# Patient Record
Sex: Male | Born: 1960 | Hispanic: No | Marital: Single | Smoking: Current every day smoker
Health system: Southern US, Community
[De-identification: ages and names within clinical notes are randomized; demographics above are authoritative.]

## PROBLEM LIST (undated history)

## (undated) DIAGNOSIS — R000 Tachycardia, unspecified: Secondary | ICD-10-CM

## (undated) DIAGNOSIS — I10 Essential (primary) hypertension: Secondary | ICD-10-CM

## (undated) DIAGNOSIS — T7840XA Allergy, unspecified, initial encounter: Secondary | ICD-10-CM

## (undated) HISTORY — DX: Allergy, unspecified, initial encounter: T78.40XA

## (undated) HISTORY — PX: APPENDECTOMY: SHX54

## (undated) HISTORY — DX: Tachycardia, unspecified: R00.0

## (undated) HISTORY — DX: Essential (primary) hypertension: I10

---

## 2021-05-13 ENCOUNTER — Ambulatory Visit: Payer: Self-pay

## 2021-05-13 DIAGNOSIS — I10 Essential (primary) hypertension: Secondary | ICD-10-CM

## 2021-05-13 NOTE — Telephone Encounter (Signed)
Second attempt to reach pt, left VM to call back. 

## 2021-05-13 NOTE — Telephone Encounter (Signed)
3 attempts to reach pt.. left VM to CB each attempt. Routing to provider for resolution per protocol.  

## 2021-05-13 NOTE — Telephone Encounter (Signed)
[place]. He is taking lisinopril and will run out in 3 days. Pt does not have an appointment until February. Would like clinical advice

## 2021-05-15 NOTE — Telephone Encounter (Signed)
Per Dr.Vigg would like to see the patient before his scheduled in office appointment on 05/29/21. Patient states she would like to see patient virtually tomorrow so she can confirm with patient of what dosing he is currently taking of blood pressure medication, before sending the prescription over to patient local pharmacy.

## 2021-05-15 NOTE — Telephone Encounter (Signed)
Patient calling back regarding his lisinopril 10 mg-hydrochlorothiazide 12.5 mg, he's out of medication and wanted to get a thirty day supply until his 12/14 appt. He called previous PCP and they wouldn't refill his medicine without an appt.   Patient stated that Destiny from the practice confirmed a refill would granted prior to his new patient appt.   ----- Message from Valora Piccolo sent at 05/13/2021  1:51 PM EST -----  Pt is calling back to speak to NT   ----- Message from Fanny Bien sent at 05/13/2021  9:57 AM EST -----  Pt states that [place] is [place]. He is taking lisinopril and will run out in 3 days. Pt does not have an appointment until February. Would like clinical advice   Called patient to review options for getting medications prescribed until 05/29/21. Patient requesting lisinopril 10 mg - hydrochlorothiazide 12.5 mg until his office visit . Patient reports he has some benazepril that he stopped taking due to causing chest pain he can break in 1/2 . Instructed patient not to take medication that causes chest pain. Instructed patient to go to UC or ED for prescription medication . Previous PCP requesting he comes back for OV and patient reports he can not do that. Please advise if a virtual visit can be set up with PCP today or tomorrow. Patient is out of medication. Care advise given. Patient verbalized understanding of care advise to go to UC or  ED for medication but feels he will go to [facility] or ED if symptoms occur. Please advise . Patient reports "Destiny" requested to call office regarding medications.

## 2021-05-16 ENCOUNTER — Telehealth (INDEPENDENT_AMBULATORY_CARE_PROVIDER_SITE_OTHER): Payer: Self-pay | Admitting: Internal Medicine

## 2021-05-16 DIAGNOSIS — Z13220 Encounter for screening for lipoid disorders: Secondary | ICD-10-CM | POA: Insufficient documentation

## 2021-05-16 DIAGNOSIS — I10 Essential (primary) hypertension: Secondary | ICD-10-CM

## 2021-05-16 MED ORDER — LISINOPRIL-HYDROCHLOROTHIAZIDE 10-12.5 MG PO TABS: 1.0000 | ORAL_TABLET | Freq: Every day | ORAL | 2 refills | Status: DC

## 2021-05-16 NOTE — Progress Notes (Signed)
There were no vitals taken for this visit.   Subjective:    Patient ID: Luke Griffin, male    DOB: 1960-07-15, 60 y.o.   MRN: [id number]  No chief complaint on file.   HPI: Luke Griffin is a 60 y.o. male   This visit was completed via telephone due to the restrictions of the COVID-19 pandemic. All issues as above were discussed and addressed but no physical exam was performed. If it was felt that the patient should be evaluated in the office, they were directed there. The patient verbally consented to this visit. Patient was unable to complete an audio/visual visit due to Technical difficulties. Due to the catastrophic nature of the COVID-19 pandemic, this visit was done through audio contact only. Location of the patient: home Location of the provider: work Those involved with this call:  Provider: Loura Pardon, MD CMA: Tristan Schroeder, CMA Front Desk/Registration: [employer]  Time spent on call: 15` minutes on the phone discussing health concerns. 10 minutes total spent in review of patient's record and preparation of their chart.  He is here to establish care, he has been on meds x 10 yrs.  Was in [place] and then in [place]. Moved to [place] x 6 months ago not seen anyone yet.    Hypertension This is a chronic problem. The current episode started more than 1 year ago. The problem is unchanged. The problem is controlled. Pertinent negatives include no anxiety, blurred vision, chest pain, headaches, malaise/fatigue, neck pain, orthopnea, palpitations, peripheral edema, PND, shortness of breath or sweats.   No chief complaint on file.   Relevant past medical, surgical, family and social history reviewed and updated as indicated. Interim medical history since our last visit reviewed. Allergies and medications reviewed and updated.  Review of Systems  Constitutional:  Negative for malaise/fatigue.  Eyes:  Negative for blurred vision.  Respiratory:  Negative for shortness  of breath.   Cardiovascular:  Negative for chest pain, palpitations, orthopnea and PND.  Musculoskeletal:  Negative for neck pain.  Neurological:  Negative for headaches.   Per HPI unless specifically indicated above     Objective:    There were no vitals taken for this visit.  Wt Readings from Last 3 Encounters:  05/17/21 192 lb 12.8 oz (87.5 kg)    Physical Exam  Unable to peform sec to virtual visit.   No results found for this or any previous visit.      Current Outpatient Medications:    lisinopril-hydrochlorothiazide (ZESTORETIC) 10-12.5 MG tablet, Take 1 tablet by mouth daily., Disp: 30 tablet, Rfl: 2   atorvastatin (LIPITOR) 20 MG tablet, Take 1 tablet (20 mg total) by mouth daily., Disp: 30 tablet, Rfl: 3   buPROPion (WELLBUTRIN XL) 150 MG 24 hr tablet, Take 1 tablet (150 mg total) by mouth daily., Disp: 30 tablet, Rfl: 1   fluticasone (FLONASE) 50 MCG/ACT nasal spray, Place 2 sprays into both nostrils daily., Disp: 9.9 mL, Rfl: 3   tadalafil (CIALIS) 5 MG tablet, Take 1 tablet (5 mg total) by mouth daily., Disp: 10 tablet, Rfl: 1    Assessment & Plan:   HTN : took his meds yesterday - is on lisinopril /hctz 10 mg / 12.5  132/ 85 mm hg.  Continue current meds.  Medication compliance emphasised. pt advised to keep Bp logs. Pt verbalised understanding of the same. Pt to have a low salt diet . Exercise to reach a goal of at least 150 mins a week.  lifestyle modifications explained and pt understands importance of the above.  Problem List Items Addressed This Visit       Cardiovascular and Mediastinum   Primary hypertension - Primary   Relevant Medications   lisinopril-hydrochlorothiazide (ZESTORETIC) 10-12.5 MG tablet   Other Relevant Orders   CBC with Differential/Platelet (Completed)   Comprehensive metabolic panel (Completed)   Urinalysis, Routine w reflex microscopic (Completed)   TSH (Completed)     Other   Screening for lipid disorders   Relevant Orders    Lipid panel (Completed)     Orders Placed This Encounter  Procedures   CBC with Differential/Platelet   Comprehensive metabolic panel   Urinalysis, Routine w reflex microscopic   TSH   Lipid panel      Meds ordered this encounter  Medications   lisinopril-hydrochlorothiazide (ZESTORETIC) 10-12.5 MG tablet    Sig: Take 1 tablet by mouth daily.    Dispense:  30 tablet    Refill:  2      Follow up plan: No follow-ups on file.

## 2021-05-17 ENCOUNTER — Other Ambulatory Visit: Payer: Self-pay

## 2021-05-17 ENCOUNTER — Ambulatory Visit (INDEPENDENT_AMBULATORY_CARE_PROVIDER_SITE_OTHER): Payer: Self-pay | Admitting: Internal Medicine

## 2021-05-17 ENCOUNTER — Encounter: Payer: Self-pay | Admitting: Internal Medicine

## 2021-05-17 VITALS — BP 156/100 | HR 91 | Temp 98.2°F | Ht 68.11 in | Wt 192.8 lb

## 2021-05-17 DIAGNOSIS — I10 Essential (primary) hypertension: Secondary | ICD-10-CM

## 2021-05-17 DIAGNOSIS — Z13220 Encounter for screening for lipoid disorders: Secondary | ICD-10-CM

## 2021-05-17 DIAGNOSIS — N529 Male erectile dysfunction, unspecified: Secondary | ICD-10-CM

## 2021-05-17 MED ORDER — BUPROPION HCL ER (XL) 150 MG PO TB24: 150.0000 mg | ORAL_TABLET | Freq: Every day | ORAL | 1 refills | Status: DC

## 2021-05-17 MED ORDER — FLUTICASONE PROPIONATE 50 MCG/ACT NA SUSP: 2.0000 | Freq: Every day | NASAL | 3 refills | Status: DC

## 2021-05-17 MED ORDER — TADALAFIL 5 MG PO TABS: 5.0000 mg | ORAL_TABLET | Freq: Every day | ORAL | 1 refills | Status: DC

## 2021-05-17 NOTE — Patient Instructions (Signed)

## 2021-05-17 NOTE — Progress Notes (Signed)
BP (!) 156/100   Pulse 91   Temp 98.2 F (36.8 C) (Oral)   Ht 5' 8.11" (1.73 m)   Wt 192 lb 12.8 oz (87.5 kg)   SpO2 98%   BMI 29.22 kg/m    Subjective:    Patient ID: Luke Griffin, male    DOB: 1961-02-28, 60 y.o.   MRN: [id number]  Chief Complaint  Patient presents with  . New Patient (Initial Visit)    Needs med refills Would like to talk to you about ED  . Hypertension    HPI: Luke Griffin is a 60 y.o. male  Pt is here to establish care. 135/145 --  85 - 90 on lisinopril / hczt 10/ 12.5 mg  Seen Dr. Ilean China @ [facility].   Hypertension This is a chronic problem. The current episode started in the past 7 days. The problem has been gradually worsening since onset. The problem is uncontrolled. Pertinent negatives include no anxiety, blurred vision, headaches, malaise/fatigue, neck pain, orthopnea, palpitations, peripheral edema, PND or sweats.   Chief Complaint  Patient presents with  . New Patient (Initial Visit)    Needs med refills Would like to talk to you about ED  . Hypertension    Relevant past medical, surgical, family and social history reviewed and updated as indicated. Interim medical history since our last visit reviewed. Allergies and medications reviewed and updated.  Review of Systems  Constitutional:  Negative for activity change, appetite change, chills, fatigue, fever and malaise/fatigue.  HENT:  Negative for congestion, ear discharge, ear pain and facial swelling.   Eyes:  Negative for blurred vision, pain, discharge and itching.  Respiratory:  Negative for cough, chest tightness and wheezing.   Cardiovascular:  Negative for palpitations, orthopnea, leg swelling and PND.  Gastrointestinal:  Negative for abdominal distention, abdominal pain, blood in stool, constipation, diarrhea, nausea and vomiting.  Endocrine: Negative for cold intolerance, heat intolerance, polydipsia, polyphagia and polyuria.  Genitourinary:  Negative for  difficulty urinating, dysuria, flank pain, frequency, hematuria and urgency.  Musculoskeletal:  Negative for arthralgias, gait problem, joint swelling and neck pain.  Skin:  Negative for color change, rash and wound.  Neurological:  Negative for dizziness, tremors, speech difficulty, weakness, light-headedness, numbness and headaches.  Hematological:  Does not bruise/bleed easily.  Psychiatric/Behavioral:  Negative for agitation, confusion, decreased concentration, sleep disturbance and suicidal ideas.    Per HPI unless specifically indicated above     Objective:    BP (!) 156/100   Pulse 91   Temp 98.2 F (36.8 C) (Oral)   Ht 5' 8.11" (1.73 m)   Wt 192 lb 12.8 oz (87.5 kg)   SpO2 98%   BMI 29.22 kg/m   Wt Readings from Last 3 Encounters:  05/17/21 192 lb 12.8 oz (87.5 kg)    Physical Exam Vitals and nursing note reviewed.  Constitutional:      General: He is not in acute distress.    Appearance: Normal appearance. He is not ill-appearing or diaphoretic.  HENT:     Head: Normocephalic and atraumatic.     Right Ear: Tympanic membrane and external ear normal. There is no impacted cerumen.     Left Ear: External ear normal.     Nose: No congestion or rhinorrhea.     Mouth/Throat:     Pharynx: No oropharyngeal exudate or posterior oropharyngeal erythema.  Eyes:     Conjunctiva/sclera: Conjunctivae normal.     Pupils: Pupils are equal, round, and reactive to light.  Cardiovascular:     Rate and Rhythm: Normal rate and regular rhythm.     Heart sounds: No murmur heard.   No friction rub. No gallop.  Pulmonary:     Effort: No respiratory distress.     Breath sounds: No stridor. No wheezing or rhonchi.  Chest:     Chest wall: No tenderness.  Abdominal:     General: Abdomen is flat. Bowel sounds are normal. There is no distension.     Palpations: Abdomen is soft. There is no mass.     Tenderness: There is no abdominal tenderness. There is no guarding.  Musculoskeletal:         General: No swelling or deformity.     Cervical back: Normal range of motion and neck supple. No rigidity or tenderness.     Right lower leg: No edema.     Left lower leg: No edema.  Skin:    General: Skin is warm and dry.     Coloration: Skin is not jaundiced.     Findings: No erythema.  Neurological:     Mental Status: He is alert and oriented to person, place, and time. Mental status is at baseline.  Psychiatric:        Mood and Affect: Mood normal.        Thought Content: Thought content normal.        Judgment: Judgment normal.    No results found for this or any previous visit.      Current Outpatient Medications:  .  lisinopril-hydrochlorothiazide (ZESTORETIC) 10-12.5 MG tablet, Take 1 tablet by mouth daily., Disp: 30 tablet, Rfl: 2 .  fluticasone (FLONASE) 50 MCG/ACT nasal spray, Place 2 sprays into both nostrils daily., Disp: 9.9 mL, Rfl: 3    Assessment & Plan:  HTN:  Hasnt picked up meds yet  Continue current meds.  Medication compliance emphasised. pt advised to keep Bp logs. Pt verbalised understanding of the same. Pt to have a low salt diet . Exercise to reach a goal of at least 150 mins a week.  lifestyle modifications explained and pt understands importance of the above.   2.smoking cessation: will need to start pt on welbutrin xl 150 mg q 24 hrly fu with me x 4 weeks.    quit cocaine 10 yrs ago Smoking cessation advised. failed nicotine patches in the past. continues to smoke. more than > 5 - 10 mins of time was spent with pt regarding smoking cessation and complications.        Problem List Items Addressed This Visit       Cardiovascular and Mediastinum   Primary hypertension     Other   Screening for lipid disorders     No orders of the defined types were placed in this encounter.    Meds ordered this encounter  Medications  . fluticasone (FLONASE) 50 MCG/ACT nasal spray    Sig: Place 2 sprays into both nostrils daily.    Dispense:  9.9  mL    Refill:  3     Follow up plan: No follow-ups on file.  Health Maintenance : Cscope :Cscope - 2012  PSA wnl last checked x 4 yr ago.  Pneumonia vaccine : prenvanar / pneumovax @ Health dept.

## 2021-05-18 LAB — LIPID PANEL
Chol/HDL Ratio: 5.4 ratio — ABNORMAL HIGH (ref 0.0–5.0)
Cholesterol, Total: 222 mg/dL — ABNORMAL HIGH (ref 100–199)
HDL: 41 mg/dL (ref >39)
LDL Chol Calc (NIH): 156 mg/dL — ABNORMAL HIGH (ref 0–99)
Triglycerides: 137 mg/dL (ref 0–149)
VLDL Cholesterol Cal: 25 mg/dL (ref 5–40)

## 2021-05-18 LAB — CBC WITH DIFFERENTIAL/PLATELET
Basophils Absolute: 0.1 10*3/uL (ref 0.0–0.2)
Basos: 1 %
EOS (ABSOLUTE): 0.5 10*3/uL — ABNORMAL HIGH (ref 0.0–0.4)
Eos: 7 %
Hematocrit: 47.7 % (ref 37.5–51.0)
Hemoglobin: 16.4 g/dL (ref 13.0–17.7)
Immature Grans (Abs): 0 10*3/uL (ref 0.0–0.1)
Immature Granulocytes: 0 %
Lymphocytes Absolute: 2.1 10*3/uL (ref 0.7–3.1)
Lymphs: 30 %
MCH: 32.6 pg (ref 26.6–33.0)
MCHC: 34.4 g/dL (ref 31.5–35.7)
MCV: 95 fL (ref 79–97)
Monocytes Absolute: 0.5 10*3/uL (ref 0.1–0.9)
Monocytes: 8 %
Neutrophils Absolute: 3.8 10*3/uL (ref 1.4–7.0)
Neutrophils: 54 %
Platelets: 161 10*3/uL (ref 150–450)
RBC: 5.03 x10E6/uL (ref 4.14–5.80)
RDW: 12.2 % (ref 11.6–15.4)
WBC: 7 10*3/uL (ref 3.4–10.8)

## 2021-05-18 LAB — COMPREHENSIVE METABOLIC PANEL
ALT: 40 IU/L (ref 0–44)
AST: 29 IU/L (ref 0–40)
Albumin/Globulin Ratio: 2.2 (ref 1.2–2.2)
Albumin: 4.8 g/dL (ref 3.8–4.9)
Alkaline Phosphatase: 84 IU/L (ref 44–121)
BUN/Creatinine Ratio: 15 (ref 9–20)
BUN: 14 mg/dL (ref 6–24)
Bilirubin Total: 0.5 mg/dL (ref 0.0–1.2)
CO2: 24 mmol/L (ref 20–29)
Calcium: 9.4 mg/dL (ref 8.7–10.2)
Chloride: 102 mmol/L (ref 96–106)
Creatinine, Ser: 0.93 mg/dL (ref 0.76–1.27)
Globulin, Total: 2.2 g/dL (ref 1.5–4.5)
Glucose: 131 mg/dL — ABNORMAL HIGH (ref 70–99)
Potassium: 4.1 mmol/L (ref 3.5–5.2)
Sodium: 141 mmol/L (ref 134–144)
Total Protein: 7 g/dL (ref 6.0–8.5)
eGFR: 95 mL/min/{1.73_m2} (ref >59)

## 2021-05-18 LAB — MICROSCOPIC EXAMINATION
Bacteria, UA: NONE SEEN
Epithelial Cells (non renal): NONE SEEN /hpf (ref 0–10)
RBC, Urine: NONE SEEN /hpf (ref 0–2)

## 2021-05-18 LAB — URINALYSIS, ROUTINE W REFLEX MICROSCOPIC
Bilirubin, UA: NEGATIVE
Glucose, UA: NEGATIVE
Ketones, UA: NEGATIVE
Leukocytes,UA: NEGATIVE
Nitrite, UA: NEGATIVE
RBC, UA: NEGATIVE
Specific Gravity, UA: 1.025 (ref 1.005–1.030)
Urobilinogen, Ur: 0.2 mg/dL (ref 0.2–1.0)
pH, UA: 6 (ref 5.0–7.5)

## 2021-05-18 LAB — TSH: TSH: 0.907 u[IU]/mL (ref 0.450–4.500)

## 2021-05-20 MED ORDER — ATORVASTATIN CALCIUM 20 MG PO TABS: 20.0000 mg | ORAL_TABLET | Freq: Every day | ORAL | 3 refills | Status: DC

## 2021-05-20 NOTE — Progress Notes (Signed)
Pts Chol and Sugars are high, please see if he was fasting before this lab work.  Will need to follow up virtually with me x this week.  Need an a1c and will send in lipitor. Pl let pt know thnx.

## 2021-05-20 NOTE — Addendum Note (Signed)
Addended byLoura Pardon on: 05/20/2021 08:50 AM   Modules accepted: Orders

## 2021-05-21 ENCOUNTER — Encounter: Payer: Self-pay | Admitting: Internal Medicine

## 2021-05-21 ENCOUNTER — Telehealth: Payer: Self-pay

## 2021-05-21 NOTE — Telephone Encounter (Signed)
Pt given lab results per notes of Dr. Charlotta Newton on 05/20/21. Pt verbalized understanding. DT Virtual scheduled for 05/22/21 at 1620 with Dr. Charlotta Newton.     Loura Pardon, MD  05/20/2021  8:49 AM EST     Pts Chol and Sugars are high, please see if he was fasting before this lab work.  Will need to follow up virtually with me x this week.  Need an a1c and will send in lipitor. Pl let pt know thnx.

## 2021-05-22 ENCOUNTER — Telehealth: Payer: Self-pay | Admitting: Internal Medicine

## 2021-05-22 NOTE — Telephone Encounter (Signed)
Called patient to discuss lab results, no answer, unable to leave a voicemail for patient to return my call.    Ok for nurse triage to give results if patient calls back.    

## 2021-05-22 NOTE — Telephone Encounter (Signed)
Copied from CRM [id number]. Topic: General - Other >> May 22, 2021  3:07 PM Gaetana Michaelis A wrote: Reason for CRM: The patient has made a call requesting to speak with a member of clinical staff when possible  The patient has concern related to recent lab work and would like to discuss them further when possible   The patient has an appointment scheduled for 05/27/21 but would like to be seen sooner if possible  Please contact further

## 2021-05-24 ENCOUNTER — Other Ambulatory Visit: Payer: Self-pay

## 2021-05-24 ENCOUNTER — Emergency Department: Payer: Self-pay

## 2021-05-24 ENCOUNTER — Emergency Department
Admission: EM | Admit: 2021-05-24 | Discharge: 2021-05-24 | Disposition: A | Discharge status: Home / Self Care | Payer: Self-pay | Attending: Emergency Medicine | Admitting: Emergency Medicine

## 2021-05-24 DIAGNOSIS — Z79899 Other long term (current) drug therapy: Secondary | ICD-10-CM | POA: Insufficient documentation

## 2021-05-24 DIAGNOSIS — H9313 Tinnitus, bilateral: Secondary | ICD-10-CM | POA: Insufficient documentation

## 2021-05-24 DIAGNOSIS — I10 Essential (primary) hypertension: Secondary | ICD-10-CM | POA: Insufficient documentation

## 2021-05-24 DIAGNOSIS — R0789 Other chest pain: Secondary | ICD-10-CM | POA: Insufficient documentation

## 2021-05-24 DIAGNOSIS — F17210 Nicotine dependence, cigarettes, uncomplicated: Secondary | ICD-10-CM | POA: Insufficient documentation

## 2021-05-24 LAB — CBC
HCT: 49.3 % (ref 39.0–52.0)
Hemoglobin: 17.1 g/dL — ABNORMAL HIGH (ref 13.0–17.0)
MCH: 33.3 pg (ref 26.0–34.0)
MCHC: 34.7 g/dL (ref 30.0–36.0)
MCV: 95.9 fL (ref 80.0–100.0)
Platelets: 195 10*3/uL (ref 150–400)
RBC: 5.14 MIL/uL (ref 4.22–5.81)
RDW: 12 % (ref 11.5–15.5)
WBC: 7.4 10*3/uL (ref 4.0–10.5)
nRBC: 0 % (ref 0.0–0.2)

## 2021-05-24 LAB — TROPONIN I (HIGH SENSITIVITY): Troponin I (High Sensitivity): 7 ng/L (ref <18)

## 2021-05-24 LAB — BASIC METABOLIC PANEL
Anion gap: 7 (ref 5–15)
BUN: 20 mg/dL (ref 6–20)
CO2: 27 mmol/L (ref 22–32)
Calcium: 8.8 mg/dL — ABNORMAL LOW (ref 8.9–10.3)
Chloride: 101 mmol/L (ref 98–111)
Creatinine, Ser: 0.97 mg/dL (ref 0.61–1.24)
GFR, Estimated: >60 mL/min (ref >60)
Glucose, Bld: 145 mg/dL — ABNORMAL HIGH (ref 70–99)
Potassium: 3.4 mmol/L — ABNORMAL LOW (ref 3.5–5.1)
Sodium: 135 mmol/L (ref 135–145)

## 2021-05-24 MED ORDER — PANTOPRAZOLE SODIUM 40 MG PO TBEC: 40.0000 mg | DELAYED_RELEASE_TABLET | Freq: Every day | ORAL | 1 refills | Status: DC

## 2021-05-24 NOTE — ED Triage Notes (Signed)
First RN Note:  Pt comes into the ED via POV c/o left side chest pain that started 3 days ago.  Pt states it is causing tingling through his body and ringing in his ears.

## 2021-05-24 NOTE — ED Provider Notes (Signed)
[facility] Emergency Department Provider Note   ____________________________________________    I have reviewed the triage vital signs and the nursing notes.   HISTORY  Chief Complaint Chest Pain     HPI Luke Griffin is a 60 y.o. male who presents with multiple complaints.  He notes frequent episodes of epigastric discomfort and has been using a lot of Pepto-Bismol for the symptoms over the last several days.  Occasionally he gets a discomfort in his chest as well.  Recently has been aware that his blood pressure has been elevated, recently saw PCP for this and has been compliant with medications.  He does smoke cigarettes and is trying to quit.  Additionally he notes that he has been having tinnitus in both ears right greater than left.  This does not seem to be related to his chest pain.  Past Medical History:  Diagnosis Date   Allergy    Hypertension    Tachycardia     Patient Active Problem List   Diagnosis Date Noted   Erectile dysfunction 05/17/2021   Primary hypertension 05/16/2021   Screening for lipid disorders 05/16/2021    Past Surgical History:  Procedure Laterality Date   APPENDECTOMY      Prior to Admission medications   Medication Sig Start Date End Date Taking? Authorizing Provider  pantoprazole (PROTONIX) 40 MG tablet Take 1 tablet (40 mg total) by mouth daily. 05/24/21 05/24/22 Yes Jene Every, MD  atorvastatin (LIPITOR) 20 MG tablet Take 1 tablet (20 mg total) by mouth daily. 05/20/21   Vigg, Avanti, MD  buPROPion (WELLBUTRIN XL) 150 MG 24 hr tablet Take 1 tablet (150 mg total) by mouth daily. 05/17/21   Vigg, Avanti, MD  fluticasone (FLONASE) 50 MCG/ACT nasal spray Place 2 sprays into both nostrils daily. 05/17/21   Vigg, Avanti, MD  lisinopril-hydrochlorothiazide (ZESTORETIC) 10-12.5 MG tablet Take 1 tablet by mouth daily. 05/16/21   Vigg, Avanti, MD  tadalafil (CIALIS) 5 MG tablet Take 1 tablet (5 mg total) by mouth  daily. 05/17/21   Loura Pardon, MD     Allergies Penicillins  Family History  Problem Relation Age of Onset   Hypertension Mother    Diabetes Mother    Lung disease Father    Diabetes Brother    Supraventricular tachycardia Maternal Grandfather     Social History Social History   Tobacco Use   Smoking status: Every Day    Packs/day: 1.00    Years: 15.00    Pack years: 15.00    Types: Cigarettes   Smokeless tobacco: Never  Vaping Use   Vaping Use: Never used  Substance Use Topics   Alcohol use: Yes   Drug use: Not Currently    Types: Marijuana, Cocaine    Comment: 35 years ago    Review of Systems  Constitutional: No fever/chills Eyes: No visual changes.  ENT: As above Cardiovascular: As above Respiratory: Denies shortness of breath. Gastrointestinal: As above, normal stools Genitourinary: Negative for dysuria. Musculoskeletal: Negative for back pain. Skin: Negative for rash. Neurological: Negative for headaches or weakness   ____________________________________________   PHYSICAL EXAM:  VITAL SIGNS: ED Triage Vitals  Enc Vitals Group     BP 05/24/21 0725 (!) 168/116     Pulse Rate 05/24/21 0725 83     Resp 05/24/21 0725 15     Temp 05/24/21 0725 97.6 F (36.4 C)     Temp Source 05/24/21 0725 Oral     SpO2 05/24/21 0743 97 %  Weight 05/24/21 0725 87.1 kg (192 lb)     Height 05/24/21 0725 1.702 m (5\' 7" )     Head Circumference --      Peak Flow --      Pain Score 05/24/21 0725 3     Pain Loc --      Pain Edu? --      Excl. in GC? --     Constitutional: Alert and oriented. No acute distress. Pleasant and interactive Eyes: Conjunctivae are normal.  Head: Atraumatic. Nose: No congestion/rhinnorhea.  Cardiovascular: Normal rate, regular rhythm. Grossly normal heart sounds.  Good peripheral circulation. Respiratory: Normal respiratory effort.  No retractions. Lungs CTAB. Gastrointestinal: Soft and nontender. No distention.  No CVA tenderness.   Reassuring exam  Musculoskeletal: No lower extremity tenderness nor edema.  Warm and well perfused Neurologic:  Normal speech and language. No gross focal neurologic deficits are appreciated.  Skin:  Skin is warm, dry and intact. No rash noted. Psychiatric: Mood and affect are normal. Speech and behavior are normal.  ____________________________________________   LABS (all labs ordered are listed, but only abnormal results are displayed)  Labs Reviewed  BASIC METABOLIC PANEL - Abnormal; Notable for the following components:      Result Value   Potassium 3.4 (*)    Glucose, Bld 145 (*)    Calcium 8.8 (*)    All other components within normal limits  CBC - Abnormal; Notable for the following components:   Hemoglobin 17.1 (*)    All other components within normal limits  TROPONIN I (HIGH SENSITIVITY)   ____________________________________________  EKG  ED ECG REPORT I, 14/09/22, the attending physician, personally viewed and interpreted this ECG.  Date: 05/24/2021  Rhythm: normal sinus rhythm QRS Axis: normal Intervals: normal ST/T Wave abnormalities: normal Narrative Interpretation: no evidence of acute ischemia  ____________________________________________  RADIOLOGY  Chest x-ray reviewed by me, no acute abnormality ____________________________________________   PROCEDURES  Procedure(s) performed: No  Procedures   Critical Care performed: No ____________________________________________   INITIAL IMPRESSION / ASSESSMENT AND PLAN / ED COURSE  Pertinent labs & imaging results that were available during my care of the patient were reviewed by me and considered in my medical decision making (see chart for details).   Patient presents with multiple complaints as detailed above.  Currently no chest discomfort here in the emergency department.  EKG is reassuring, high sensitive troponin is normal.  Doubt ACS or angina.  Suspicious for GERD given epigastric  discomfort, Pepto-Bismol use and occasional chest discomfort.  We will start the patient on Protonix but also have him follow-up closely with cardiology given his history of high blood pressure, smoking use and atypical chest discomfort  Strict return precautions given, patient agrees to return to the ED if any worsening chest pain.    ____________________________________________   FINAL CLINICAL IMPRESSION(S) / ED DIAGNOSES  Final diagnoses:  Atypical chest pain        Note:  This document was prepared using Dragon voice recognition software and may include unintentional dictation errors.    14/02/2021, MD 05/24/21 [id number]

## 2021-05-27 ENCOUNTER — Ambulatory Visit (INDEPENDENT_AMBULATORY_CARE_PROVIDER_SITE_OTHER): Payer: Self-pay | Admitting: Internal Medicine

## 2021-05-27 ENCOUNTER — Encounter: Payer: Self-pay | Admitting: Internal Medicine

## 2021-05-27 ENCOUNTER — Other Ambulatory Visit: Payer: Self-pay

## 2021-05-27 VITALS — BP 110/70 | HR 67 | Temp 97.6°F | Ht 68.11 in | Wt 188.0 lb

## 2021-05-27 DIAGNOSIS — R739 Hyperglycemia, unspecified: Secondary | ICD-10-CM

## 2021-05-27 DIAGNOSIS — Z1211 Encounter for screening for malignant neoplasm of colon: Secondary | ICD-10-CM

## 2021-05-27 DIAGNOSIS — K5792 Diverticulitis of intestine, part unspecified, without perforation or abscess without bleeding: Secondary | ICD-10-CM | POA: Insufficient documentation

## 2021-05-27 LAB — BAYER DCA HB A1C WAIVED: HB A1C (BAYER DCA - WAIVED): 5.8 % — ABNORMAL HIGH (ref 4.8–5.6)

## 2021-05-27 NOTE — Progress Notes (Signed)
BP 110/70   Pulse 67   Temp 97.6 F (36.4 C) (Oral)   Ht 5' 8.11" (1.73 m)   Wt 188 lb (85.3 kg)   SpO2 97%   BMI 28.49 kg/m    Subjective:    Patient ID: Luke Griffin, male    DOB: 02-15-1961, 60 y.o.   MRN: [id number]  Chief Complaint  Patient presents with  . Hyperlipidemia  . Hospitalization Follow-up    HPI: Luke Griffin is a 60 y.o. male  Pt says he had nausea / tinnitus was disorientated   bp was high @ home and went to the ER for such has a ho diverticulitis hence went in , was diagnosed with GERD was placed on protonix.  Bp was (!) 168/116 in the ER .  Hyperlipidemia This is a chronic problem. He has no history of chronic renal disease.  Hypertension This is a chronic (bp improved.) problem. The current episode started more than 1 month ago. Pertinent negatives include no headaches. There is no history of chronic renal disease or coarctation of the aorta.  Abdominal Pain This is a recurrent (left lower quadrant pain) problem. The pain is located in the LLQ. The pain is at a severity of 2/10. The pain is moderate. The quality of the pain is aching. The abdominal pain does not radiate. Pertinent negatives include no anorexia, fever, flatus, frequency or headaches.   Chief Complaint  Patient presents with  . Hyperlipidemia  . Hospitalization Follow-up    Relevant past medical, surgical, family and social history reviewed and updated as indicated. Interim medical history since our last visit reviewed. Allergies and medications reviewed and updated.  Review of Systems  Constitutional:  Negative for fever.  Gastrointestinal:  Positive for abdominal pain. Negative for anorexia and flatus.  Genitourinary:  Negative for frequency.  Neurological:  Negative for headaches.   Per HPI unless specifically indicated above     Objective:    BP 110/70   Pulse 67   Temp 97.6 F (36.4 C) (Oral)   Ht 5' 8.11" (1.73 m)   Wt 188 lb (85.3 kg)   SpO2 97%   BMI  28.49 kg/m   Wt Readings from Last 3 Encounters:  05/27/21 188 lb (85.3 kg)  05/24/21 192 lb (87.1 kg)  05/17/21 192 lb 12.8 oz (87.5 kg)    Physical Exam  Results for orders placed or performed during the hospital encounter of 05/24/21  Basic metabolic panel  Result Value Ref Range   Sodium 135 135 - 145 mmol/L   Potassium 3.4 (L) 3.5 - 5.1 mmol/L   Chloride 101 98 - 111 mmol/L   CO2 27 22 - 32 mmol/L   Glucose, Bld 145 (H) 70 - 99 mg/dL   BUN 20 6 - 20 mg/dL   Creatinine, Ser 8.36 0.61 - 1.24 mg/dL   Calcium 8.8 (L) 8.9 - 10.3 mg/dL   GFR, Estimated >62 >94 mL/min   Anion gap 7 5 - 15  CBC  Result Value Ref Range   WBC 7.4 4.0 - 10.5 K/uL   RBC 5.14 4.22 - 5.81 MIL/uL   Hemoglobin 17.1 (H) 13.0 - 17.0 g/dL   HCT 76.5 46.5 - 03.5 %   MCV 95.9 80.0 - 100.0 fL   MCH 33.3 26.0 - 34.0 pg   MCHC 34.7 30.0 - 36.0 g/dL   RDW 46.5 68.1 - 27.5 %   Platelets 195 150 - 400 K/uL   nRBC 0.0 0.0 - 0.2 %  Troponin I (High Sensitivity)  Result Value Ref Range   Troponin I (High Sensitivity) 7 <18 ng/L        Current Outpatient Medications:  .  atorvastatin (LIPITOR) 20 MG tablet, Take 1 tablet (20 mg total) by mouth daily., Disp: 30 tablet, Rfl: 3 .  fluticasone (FLONASE) 50 MCG/ACT nasal spray, Place 2 sprays into both nostrils daily., Disp: 9.9 mL, Rfl: 3 .  lisinopril-hydrochlorothiazide (ZESTORETIC) 10-12.5 MG tablet, Take 1 tablet by mouth daily., Disp: 30 tablet, Rfl: 2 .  pantoprazole (PROTONIX) 40 MG tablet, Take 1 tablet (40 mg total) by mouth daily., Disp: 30 tablet, Rfl: 1 .  buPROPion (WELLBUTRIN XL) 150 MG 24 hr tablet, Take 1 tablet (150 mg total) by mouth daily. (Patient not taking: Reported on 05/27/2021), Disp: 30 tablet, Rfl: 1 .  tadalafil (CIALIS) 5 MG tablet, Take 1 tablet (5 mg total) by mouth daily. (Patient not taking: Reported on 05/27/2021), Disp: 10 tablet, Rfl: 1    Assessment & Plan:  Hyperglycemia ;  - check a1c today Was on metformin in the past.   A1c  was about 7.8 x 8 yrs ago..  2. Abdominal pain LLQ pain ? Diverticulitis / GERD  Will refer to Gi   3. HTN :  Continue current meds.  Medication compliance emphasised. pt advised to keep Bp logs. Pt verbalised understanding of the same. Pt to have a low salt diet . Exercise to reach a goal of at least 150 mins a week.  lifestyle modifications explained and pt understands importance of the above.  Problem List Items Addressed This Visit   None    No orders of the defined types were placed in this encounter.    No orders of the defined types were placed in this encounter.    Follow up plan: No follow-ups on file.

## 2021-05-27 NOTE — Progress Notes (Signed)
Pl let him know this was normal thnx.

## 2021-05-29 ENCOUNTER — Ambulatory Visit: Payer: Self-pay | Admitting: Internal Medicine

## 2021-05-30 ENCOUNTER — Telehealth: Payer: Self-pay

## 2021-05-30 ENCOUNTER — Other Ambulatory Visit: Payer: Self-pay

## 2021-05-30 NOTE — Progress Notes (Signed)
ERROR

## 2021-05-30 NOTE — Telephone Encounter (Signed)
WANTS TO CALL us BACK WHEN HE GETS INSURANCE TO SCHEDULE

## 2021-06-14 ENCOUNTER — Ambulatory Visit: Payer: Self-pay | Admitting: Internal Medicine

## 2021-06-19 ENCOUNTER — Other Ambulatory Visit: Payer: Self-pay

## 2021-06-19 ENCOUNTER — Ambulatory Visit (INDEPENDENT_AMBULATORY_CARE_PROVIDER_SITE_OTHER): Payer: Self-pay | Admitting: Internal Medicine

## 2021-06-19 ENCOUNTER — Encounter: Payer: Self-pay | Admitting: Internal Medicine

## 2021-06-19 VITALS — BP 132/84 | HR 86 | Temp 98.2°F | Ht 68.11 in | Wt 184.8 lb

## 2021-06-19 DIAGNOSIS — I10 Essential (primary) hypertension: Secondary | ICD-10-CM

## 2021-06-19 DIAGNOSIS — R739 Hyperglycemia, unspecified: Secondary | ICD-10-CM

## 2021-06-19 DIAGNOSIS — R7303 Prediabetes: Secondary | ICD-10-CM

## 2021-06-19 DIAGNOSIS — R062 Wheezing: Secondary | ICD-10-CM

## 2021-06-19 MED ORDER — LISINOPRIL 40 MG PO TABS: 40.0000 mg | ORAL_TABLET | Freq: Every day | ORAL | 3 refills | Status: DC

## 2021-06-19 MED ORDER — HYDROXYZINE PAMOATE 25 MG PO CAPS: 25.0000 mg | ORAL_CAPSULE | ORAL | 0 refills | Status: DC | PRN

## 2021-06-19 MED ORDER — METHYLPREDNISOLONE 4 MG PO TBPK: ORAL_TABLET | ORAL | 0 refills | Status: DC

## 2021-06-19 MED ORDER — IPRATROPIUM-ALBUTEROL 0.5-2.5 (3) MG/3ML IN SOLN
3.0000 mL | Freq: Once | RESPIRATORY_TRACT | Status: AC
  Administered 2021-06-19: 3 mL via RESPIRATORY_TRACT

## 2021-06-19 MED ORDER — ALBUTEROL SULFATE HFA 108 (90 BASE) MCG/ACT IN AERS: 2.0000 | INHALATION_SPRAY | Freq: Four times a day (QID) | RESPIRATORY_TRACT | 2 refills | Status: DC | PRN

## 2021-06-19 NOTE — Patient Instructions (Signed)

## 2021-06-19 NOTE — Progress Notes (Signed)
BP 132/84    Pulse 86    Temp 98.2 F (36.8 C) (Oral)    Ht 5' 8.11" (1.73 m)    Wt 184 lb 12.8 oz (83.8 kg)    SpO2 96%    BMI 28.01 kg/m    Subjective:    Patient ID: Luke Griffin, male    DOB: 1961-03-06, 61 y.o.   MRN: [id number]  Chief Complaint  Patient presents with   Diverticulitis   Anxiety    HPI: Luke Griffin is a 61 y.o. male  Pt is here for a follow up he has a ho HTN/ HLD/ anxiety   Anxiety Presents for initial (has had anxiety since his wife passed says his heart starts racing used to take clonazepam in the past. says he used to drink whiskey) visit. Episode onset: doesnt have insurance right now. declines ccm refral currently. Patient reports no chest pain, confusion, decreased concentration, dizziness, nausea, palpitations, shortness of breath or suicidal ideas.    Hypertension Associated symptoms include anxiety. Pertinent negatives include no chest pain, headaches, palpitations or shortness of breath.  Hyperlipidemia This is a chronic problem. The problem is controlled. Pertinent negatives include no chest pain, myalgias or shortness of breath.   Chief Complaint  Patient presents with   Diverticulitis   Anxiety    Relevant past medical, surgical, family and social history reviewed and updated as indicated. Interim medical history since our last visit reviewed. Allergies and medications reviewed and updated.  Review of Systems  Constitutional:  Negative for activity change, appetite change, chills, fatigue and fever.  HENT:  Negative for congestion, ear discharge, ear pain and facial swelling.   Eyes:  Negative for pain, discharge and itching.  Respiratory:  Negative for cough, chest tightness, shortness of breath and wheezing.   Cardiovascular:  Negative for chest pain, palpitations and leg swelling.  Gastrointestinal:  Negative for abdominal distention, abdominal pain, blood in stool, constipation, diarrhea, nausea and vomiting.  Endocrine:  Negative for cold intolerance, heat intolerance, polydipsia, polyphagia and polyuria.  Genitourinary:  Negative for difficulty urinating, dysuria, flank pain, frequency, hematuria and urgency.  Musculoskeletal:  Negative for arthralgias, gait problem, joint swelling and myalgias.  Skin:  Negative for color change, rash and wound.  Neurological:  Negative for dizziness, tremors, speech difficulty, weakness, light-headedness, numbness and headaches.  Hematological:  Does not bruise/bleed easily.  Psychiatric/Behavioral:  Negative for agitation, confusion, decreased concentration, sleep disturbance and suicidal ideas.    Per HPI unless specifically indicated above     Objective:    BP 132/84    Pulse 86    Temp 98.2 F (36.8 C) (Oral)    Ht 5' 8.11" (1.73 m)    Wt 184 lb 12.8 oz (83.8 kg)    SpO2 96%    BMI 28.01 kg/m   Wt Readings from Last 3 Encounters:  06/19/21 184 lb 12.8 oz (83.8 kg)  05/27/21 188 lb (85.3 kg)  05/24/21 192 lb (87.1 kg)    Physical Exam Vitals and nursing note reviewed.  Constitutional:      General: He is not in acute distress.    Appearance: Normal appearance. He is not ill-appearing or diaphoretic.  HENT:     Head: Normocephalic and atraumatic.     Right Ear: Tympanic membrane and external ear normal. There is no impacted cerumen.     Left Ear: External ear normal.     Nose: No congestion or rhinorrhea.     Mouth/Throat:  Pharynx: No oropharyngeal exudate or posterior oropharyngeal erythema.  Eyes:     Conjunctiva/sclera: Conjunctivae normal.     Pupils: Pupils are equal, round, and reactive to light.  Cardiovascular:     Rate and Rhythm: Normal rate and regular rhythm.     Heart sounds: No murmur heard.   No friction rub. No gallop.  Pulmonary:     Effort: No respiratory distress.     Breath sounds: No stridor. No wheezing or rhonchi.  Chest:     Chest wall: No tenderness.  Abdominal:     General: Abdomen is flat. Bowel sounds are normal.      Palpations: Abdomen is soft. There is no mass.     Tenderness: There is no abdominal tenderness.  Musculoskeletal:     Cervical back: Normal range of motion and neck supple. No rigidity or tenderness.     Left lower leg: No edema.  Skin:    General: Skin is warm and dry.  Neurological:     Mental Status: He is alert.    Results for orders placed or performed in visit on 05/27/21  Bayer DCA Hb A1c Waived (STAT)  Result Value Ref Range   HB A1C (BAYER DCA - WAIVED) 5.8 (H) 4.8 - 5.6 %        Current Outpatient Medications:    atorvastatin (LIPITOR) 20 MG tablet, Take 1 tablet (20 mg total) by mouth daily., Disp: 30 tablet, Rfl: 3   fluticasone (FLONASE) 50 MCG/ACT nasal spray, Place 2 sprays into both nostrils daily., Disp: 9.9 mL, Rfl: 3   hydrOXYzine (VISTARIL) 25 MG capsule, Take 1 capsule (25 mg total) by mouth as needed., Disp: 15 capsule, Rfl: 0   lisinopril-hydrochlorothiazide (ZESTORETIC) 10-12.5 MG tablet, Take 1 tablet by mouth daily., Disp: 30 tablet, Rfl: 2   pantoprazole (PROTONIX) 40 MG tablet, Take 1 tablet (40 mg total) by mouth daily., Disp: 30 tablet, Rfl: 1   tadalafil (CIALIS) 5 MG tablet, Take 1 tablet (5 mg total) by mouth daily., Disp: 10 tablet, Rfl: 1    Assessment & Plan:  Prediabetes  Lost about 192--- 184 7-8 lbs x 2 months.  Lifestyle modifications advised to pt. A1c at 5.8   Portion control and avoiding high carb low fat diet advised.  Diet plan given to pt   exercise plan given and encouraged.  To increase exercise to 150 mins a week ie 21/2 hours a week. Pt verbalises understanding of the above.   HTN is on lisinopril/ HCTZ -- stop hctz sec to Hypokalemia . Continue current meds.  Medication compliance emphasised. pt advised to keep Bp logs. Pt verbalised understanding of the same. Pt to have a low salt diet . Exercise to reach a goal of at least 150 mins a week.  lifestyle modifications explained and pt understands importance of the  above.  Anxiety  Stable,will start pt on hydroxyzine for such prn   Problem List Items Addressed This Visit   None    No orders of the defined types were placed in this encounter.    Meds ordered this encounter  Medications   hydrOXYzine (VISTARIL) 25 MG capsule    Sig: Take 1 capsule (25 mg total) by mouth as needed.    Dispense:  15 capsule    Refill:  0     Follow up plan: No follow-ups on file.

## 2021-06-24 ENCOUNTER — Other Ambulatory Visit: Payer: Self-pay

## 2021-06-24 ENCOUNTER — Ambulatory Visit (INDEPENDENT_AMBULATORY_CARE_PROVIDER_SITE_OTHER): Payer: Self-pay | Admitting: Internal Medicine

## 2021-06-24 ENCOUNTER — Ambulatory Visit: Payer: Self-pay | Admitting: *Deleted

## 2021-06-24 ENCOUNTER — Encounter: Payer: Self-pay | Admitting: Internal Medicine

## 2021-06-24 VITALS — BP 170/100 | HR 70 | Temp 98.3°F | Ht 68.11 in | Wt 184.0 lb

## 2021-06-24 DIAGNOSIS — I10 Essential (primary) hypertension: Secondary | ICD-10-CM

## 2021-06-24 MED ORDER — HYDROXYZINE PAMOATE 25 MG PO CAPS: 25.0000 mg | ORAL_CAPSULE | ORAL | 0 refills | Status: DC | PRN

## 2021-06-24 MED ORDER — HYDROCHLOROTHIAZIDE 12.5 MG PO TABS: 12.5000 mg | ORAL_TABLET | Freq: Every day | ORAL | 3 refills | Status: DC

## 2021-06-24 NOTE — Patient Instructions (Signed)
Low-Sodium Eating Plan Sodium, which is an element that makes up salt, helps you maintain a healthy balance of fluids in your body. Too much sodium can increase your blood pressure and cause fluid and waste to be held in your body. Your health care provider or dietitian may recommend following this plan if you have high blood pressure (hypertension), kidney disease, liver disease, or heart failure. Eating less sodium can help lower your blood pressure, reduce swelling, and protect your heart, liver, and kidneys. What are tips for following this plan? Reading food labels The Nutrition Facts label lists the amount of sodium in one serving of the food. If you eat more than one serving, you must multiply the listed amount of sodium by the number of servings. Choose foods with less than 140 mg of sodium per serving. Avoid foods with 300 mg of sodium or more per serving. Shopping  Look for lower-sodium products, often labeled as "low-sodium" or "no salt added." Always check the sodium content, even if foods are labeled as "unsalted" or "no salt added." Buy fresh foods. Avoid canned foods and pre-made or frozen meals. Avoid canned, cured, or processed meats. Buy breads that have less than 80 mg of sodium per slice. Cooking  Eat more home-cooked food and less restaurant, buffet, and fast food. Avoid adding salt when cooking. Use salt-free seasonings or herbs instead of table salt or sea salt. Check with your health care provider or pharmacist before using salt substitutes. Cook with plant-based oils, such as canola, sunflower, or olive oil. Meal planning When eating at a restaurant, ask that your food be prepared with less salt or no salt, if possible. Avoid dishes labeled as brined, pickled, cured, smoked, or made with soy sauce, miso, or teriyaki sauce. Avoid foods that contain MSG (monosodium glutamate). MSG is sometimes added to Chinese food, bouillon, and some canned foods. Make meals that can  be grilled, baked, poached, roasted, or steamed. These are generally made with less sodium. General information Most people on this plan should limit their sodium intake to 1,500-2,000 mg (milligrams) of sodium each day. What foods should I eat? Fruits Fresh, frozen, or canned fruit. Fruit juice. Vegetables Fresh or frozen vegetables. "No salt added" canned vegetables. "No salt added" tomato sauce and paste. Low-sodium or reduced-sodium tomato and vegetable juice. Grains Low-sodium cereals, including oats, puffed wheat and rice, and shredded wheat. Low-sodium crackers. Unsalted rice. Unsalted pasta. Low-sodium bread. Whole-grain breads and whole-grain pasta. Meats and other proteins Fresh or frozen (no salt added) meat, poultry, seafood, and fish. Low-sodium canned tuna and salmon. Unsalted nuts. Dried peas, beans, and lentils without added salt. Unsalted canned beans. Eggs. Unsalted nut butters. Dairy Milk. Soy milk. Cheese that is naturally low in sodium, such as ricotta cheese, fresh mozzarella, or Swiss cheese. Low-sodium or reduced-sodium cheese. Cream cheese. Yogurt. Seasonings and condiments Fresh and dried herbs and spices. Salt-free seasonings. Low-sodium mustard and ketchup. Sodium-free salad dressing. Sodium-free light mayonnaise. Fresh or refrigerated horseradish. Lemon juice. Vinegar. Other foods Homemade, reduced-sodium, or low-sodium soups. Unsalted popcorn and pretzels. Low-salt or salt-free chips. The items listed above may not be a complete list of foods and beverages you can eat. Contact a dietitian for more information. What foods should I avoid? Vegetables Sauerkraut, pickled vegetables, and relishes. Olives. French fries. Onion rings. Regular canned vegetables (not low-sodium or reduced-sodium). Regular canned tomato sauce and paste (not low-sodium or reduced-sodium). Regular tomato and vegetable juice (not low-sodium or reduced-sodium). Frozen vegetables in  sauces. Grains   Instant hot cereals. Bread stuffing, pancake, and biscuit mixes. Croutons. Seasoned rice or pasta mixes. Noodle soup cups. Boxed or frozen macaroni and cheese. Regular salted crackers. Self-rising flour. Meats and other proteins Meat or fish that is salted, canned, smoked, spiced, or pickled. Precooked or cured meat, such as sausages or meat loaves. Bacon. Ham. Pepperoni. Hot dogs. Corned beef. Chipped beef. Salt pork. Jerky. Pickled herring. Anchovies and sardines. Regular canned tuna. Salted nuts. Dairy Processed cheese and cheese spreads. Hard cheeses. Cheese curds. Blue cheese. Feta cheese. String cheese. Regular cottage cheese. Buttermilk. Canned milk. Fats and oils Salted butter. Regular margarine. Ghee. Bacon fat. Seasonings and condiments Onion salt, garlic salt, seasoned salt, table salt, and sea salt. Canned and packaged gravies. Worcestershire sauce. Tartar sauce. Barbecue sauce. Teriyaki sauce. Soy sauce, including reduced-sodium. Steak sauce. Fish sauce. Oyster sauce. Cocktail sauce. Horseradish that you find on the shelf. Regular ketchup and mustard. Meat flavorings and tenderizers. Bouillon cubes. Hot sauce. Pre-made or packaged marinades. Pre-made or packaged taco seasonings. Relishes. Regular salad dressings. Salsa. Other foods Salted popcorn and pretzels. Corn chips and puffs. Potato and tortilla chips. Canned or dried soups. Pizza. Frozen entrees and pot pies. The items listed above may not be a complete list of foods and beverages you should avoid. Contact a dietitian for more information. Summary Eating less sodium can help lower your blood pressure, reduce swelling, and protect your heart, liver, and kidneys. Most people on this plan should limit their sodium intake to 1,500-2,000 mg (milligrams) of sodium each day. Canned, boxed, and frozen foods are high in sodium. Restaurant foods, fast foods, and pizza are also very high in sodium. You also get sodium by  adding salt to food. Try to cook at home, eat more fresh fruits and vegetables, and eat less fast food and canned, processed, or prepared foods. This information is not intended to replace advice given to you by your health care provider. Make sure you discuss any questions you have with your health care provider. Document Revised: 07/08/2019 Document Reviewed: 05/04/2019 Elsevier Patient Education  2022 [employer] Inc[employer]  

## 2021-06-24 NOTE — Progress Notes (Signed)
BP (!) 170/100    Pulse 70    Temp 98.3 F (36.8 C) (Oral)    Ht 5' 8.11" (1.73 m)    Wt 184 lb (83.5 kg)    SpO2 97%    BMI 27.89 kg/m    Subjective:    Patient ID: Luke Griffin, male    DOB: [birth year], 61 y.o.   MRN: [id number]  Chief Complaint  Patient presents with   Hypertension    HPI: Luke Griffin is a 61 y.o. male  Hypertension This is a chronic (bp at home high at 175/112 this morning, 198/120 mm hg.) problem. The current episode started yesterday (lisnipril bumped up last visit howver has continued elevated bp readings.). Associated symptoms include headaches. Pertinent negatives include no anxiety, blurred vision, chest pain, neck pain, orthopnea, palpitations, peripheral edema, PND, shortness of breath or sweats.   Chief Complaint  Patient presents with   Hypertension    Relevant past medical, surgical, family and social history reviewed and updated as indicated. Interim medical history since our last visit reviewed. Allergies and medications reviewed and updated.  Review of Systems  Eyes:  Negative for blurred vision.  Respiratory:  Negative for shortness of breath.   Cardiovascular:  Negative for chest pain, palpitations, orthopnea and PND.  Musculoskeletal:  Negative for neck pain.  Neurological:  Positive for headaches.   Per HPI unless specifically indicated above     Objective:    BP (!) 170/100    Pulse 70    Temp 98.3 F (36.8 C) (Oral)    Ht 5' 8.11" (1.73 m)    Wt 184 lb (83.5 kg)    SpO2 97%    BMI 27.89 kg/m   Wt Readings from Last 3 Encounters:  06/24/21 184 lb (83.5 kg)  06/19/21 184 lb 12.8 oz (83.8 kg)  05/27/21 188 lb (85.3 kg)    Physical Exam Vitals and nursing note reviewed.  Constitutional:      General: He is not in acute distress.    Appearance: Normal appearance. He is not ill-appearing or diaphoretic.  HENT:     Head: Normocephalic and atraumatic.     Right Ear: Tympanic membrane and external ear normal. There is  no impacted cerumen.     Left Ear: External ear normal.     Nose: No congestion or rhinorrhea.     Mouth/Throat:     Pharynx: No oropharyngeal exudate or posterior oropharyngeal erythema.  Eyes:     Conjunctiva/sclera: Conjunctivae normal.     Pupils: Pupils are equal, round, and reactive to light.  Cardiovascular:     Rate and Rhythm: Normal rate and regular rhythm.     Heart sounds: No murmur heard.   No friction rub. No gallop.  Pulmonary:     Effort: No respiratory distress.     Breath sounds: No stridor. No wheezing or rhonchi.  Chest:     Chest wall: No tenderness.  Musculoskeletal:     Cervical back: Normal range of motion and neck supple. No rigidity or tenderness.     Left lower leg: No edema.  Skin:    General: Skin is warm and dry.  Neurological:     Mental Status: He is alert.  Psychiatric:        Mood and Affect: Mood normal.    Results for orders placed or performed in visit on 05/27/21  Bayer DCA Hb A1c Waived (STAT)  Result Value Ref Range   HB A1C (BAYER DCA -  WAIVED) 5.8 (H) 4.8 - 5.6 %        Current Outpatient Medications:    albuterol (VENTOLIN HFA) 108 (90 Base) MCG/ACT inhaler, Inhale 2 puffs into the lungs every 6 (six) hours as needed for wheezing or shortness of breath., Disp: 8 g, Rfl: 2   atorvastatin (LIPITOR) 20 MG tablet, Take 1 tablet (20 mg total) by mouth daily., Disp: 30 tablet, Rfl: 3   fluticasone (FLONASE) 50 MCG/ACT nasal spray, Place 2 sprays into both nostrils daily., Disp: 9.9 mL, Rfl: 3   lisinopril (ZESTRIL) 40 MG tablet, Take 1 tablet (40 mg total) by mouth daily., Disp: 90 tablet, Rfl: 3   methylPREDNISolone (MEDROL DOSEPAK) 4 MG TBPK tablet, Use as directed, Disp: 1 each, Rfl: 0   pantoprazole (PROTONIX) 40 MG tablet, Take 1 tablet (40 mg total) by mouth daily., Disp: 30 tablet, Rfl: 1   hydrOXYzine (VISTARIL) 25 MG capsule, Take 1 capsule (25 mg total) by mouth as needed. (Patient not taking: Reported on 06/24/2021),  Disp: 15 capsule, Rfl: 0   tadalafil (CIALIS) 5 MG tablet, Take 1 tablet (5 mg total) by mouth daily. (Patient not taking: Reported on 06/24/2021), Disp: 10 tablet, Rfl: 1    Assessment & Plan:  Htn is on lisinopril 40 mg Continue current med Restart hctz 12/5 mg edication compliance emphasised. pt advised to keep Bp logs. Pt verbalised understanding of the same. Pt to have a low salt diet . Exercise to reach a goal of at least 150 mins a week.  lifestyle modifications explained and pt understands importance of the above.    Problem List Items Addressed This Visit   None    No orders of the defined types were placed in this encounter.    No orders of the defined types were placed in this encounter.    Follow up plan: No follow-ups on file.

## 2021-06-24 NOTE — Telephone Encounter (Signed)
Reason for Disposition  Systolic BP  >= 180 OR Diastolic >= 110  Answer Assessment - Initial Assessment Questions 1. BLOOD PRESSURE: "What is the blood pressure?" "Did you take at least two measurements 5 minutes apart?"     175/115 today.   3-4 days my BP is elevated.   Dr. Charlotta Newton changed my med.  119/116 yesterday. 2. ONSET: "When did you take your blood pressure?"     Today 3. HOW: "How did you obtain the blood pressure?" (e.g., visiting nurse, automatic home BP monitor)     Home BP machine 4. HISTORY: "Do you have a history of high blood pressure?"     Yes 5. MEDICATIONS: "Are you taking any medications for blood pressure?" "Have you missed any doses recently?"     Yes I was on lisinopril and she changed the dose.   I was on a beta blocker years ago for BP that worked good.    6. OTHER SYMPTOMS: "Do you have any symptoms?" (e.g., headache, chest pain, blurred vision, difficulty breathing, weakness)     Having headaches bad. 7. PREGNANCY: "Is there any chance you are pregnant?" "When was your last menstrual period?"     N/A  Protocols used: Blood Pressure - High-A-AH  Chief Complaint: elevated BP since BP med change on 06/19/2021.  175/115 today Symptoms: Bad headaches Frequency: daily and BP elevated daily Pertinent Negatives: Patient denies dizziness or visual changes Disposition: [] ED /[] Urgent Care (no appt availability in office) / [x] Appointment(In office/virtual)/ []  [facility]/ [] Home Care/ [] Refused Recommended Disposition /[] [facility]/ []  Follow-up with PCP Additional Notes:

## 2021-07-02 ENCOUNTER — Telehealth: Payer: Self-pay | Admitting: Internal Medicine

## 2021-07-02 NOTE — Telephone Encounter (Signed)
Requested medication (s) are due for refill today: see note  Requested medication (s) are on the active medication list: yes  Last refill:  06/24/21 #15 0 refills  Future visit scheduled: yes in 6 days  Notes to clinic:  patient called back as requested by PCP for update and reports medication is helping a lot. Can patient get full dose prescribed for a month supply?     Requested Prescriptions  Pending Prescriptions Disp Refills   hydrOXYzine (VISTARIL) 25 MG capsule 15 capsule 0    Sig: Take 1 capsule (25 mg total) by mouth as needed.     Ear, Nose, and Throat:  Antihistamines Passed - 07/02/2021  9:21 AM      Passed - Valid encounter within last 12 months    Recent Outpatient Visits           1 week ago Primary hypertension   [facility] Vigg, Avanti, MD   1 week ago Primary hypertension   [facility] Vigg, Avanti, MD   1 month ago Diverticulitis   [facility] Vigg, Avanti, MD   1 month ago Erectile dysfunction, unspecified erectile dysfunction type   [facility] Vigg, Avanti, MD   1 month ago Primary hypertension   [facility] Vigg, Avanti, MD       Future Appointments             In 6 days Vigg, Avanti, MD [facility]   In 3 months Vigg, Avanti, MD [facility], [facility]

## 2021-07-02 NOTE — Telephone Encounter (Signed)
Pt is calling to request a refill. Pt says that he was told to update provider first to let her know how the medication is working. Pt says that the medication (hydrOXYzine (VISTARIL) 25 MG capsule )is helping a lot. Pt would like to have a full Rx sent to   Pharmacy:  [facility] [address] ([address]), [place] - [address] Phone:  [phone]  Fax:  [phone]

## 2021-07-03 MED ORDER — HYDROXYZINE PAMOATE 25 MG PO CAPS: 25.0000 mg | ORAL_CAPSULE | ORAL | 0 refills | Status: DC | PRN

## 2021-07-03 NOTE — Telephone Encounter (Signed)
Patient called in to inform Dr Neomia Dear that he is down to 1 pill and need refill sent in today please advise

## 2021-07-03 NOTE — Telephone Encounter (Signed)
Per Dr Charlotta Newton OK for the refill. Prescription was sent over to patient local pharmacy.

## 2021-07-03 NOTE — Addendum Note (Signed)
Addended by: Malen Gauze on: 07/03/2021 01:19 PM   Modules accepted: Orders

## 2021-07-08 ENCOUNTER — Ambulatory Visit: Payer: Self-pay | Admitting: Internal Medicine

## 2021-07-17 ENCOUNTER — Ambulatory Visit (INDEPENDENT_AMBULATORY_CARE_PROVIDER_SITE_OTHER): Payer: Self-pay | Admitting: Internal Medicine

## 2021-07-17 ENCOUNTER — Other Ambulatory Visit: Payer: Self-pay

## 2021-07-17 ENCOUNTER — Encounter: Payer: Self-pay | Admitting: Internal Medicine

## 2021-07-17 VITALS — BP 172/88 | HR 66 | Temp 99.0°F | Ht 68.11 in | Wt 184.0 lb

## 2021-07-17 DIAGNOSIS — I10 Essential (primary) hypertension: Secondary | ICD-10-CM

## 2021-07-17 DIAGNOSIS — R052 Subacute cough: Secondary | ICD-10-CM

## 2021-07-17 DIAGNOSIS — H9313 Tinnitus, bilateral: Secondary | ICD-10-CM

## 2021-07-17 MED ORDER — UMECLIDINIUM-VILANTEROL 62.5-25 MCG/ACT IN AEPB: 1.0000 | INHALATION_SPRAY | Freq: Every day | RESPIRATORY_TRACT | 4 refills | Status: DC

## 2021-07-17 MED ORDER — HYDROCHLOROTHIAZIDE 25 MG PO TABS: 25.0000 mg | ORAL_TABLET | Freq: Every day | ORAL | 1 refills | Status: DC

## 2021-07-17 MED ORDER — IPRATROPIUM-ALBUTEROL 0.5-2.5 (3) MG/3ML IN SOLN
3.0000 mL | Freq: Once | RESPIRATORY_TRACT | Status: AC
  Administered 2021-07-17: 3 mL via RESPIRATORY_TRACT

## 2021-07-17 MED ORDER — AMLODIPINE BESYLATE 2.5 MG PO TABS: 2.5000 mg | ORAL_TABLET | Freq: Every day | ORAL | 3 refills | Status: DC

## 2021-07-17 MED ORDER — AZITHROMYCIN 250 MG PO TABS: ORAL_TABLET | ORAL | 0 refills | Status: AC

## 2021-07-17 MED ORDER — METHYLPREDNISOLONE SODIUM SUCC 40 MG IJ SOLR
40.0000 mg | Freq: Once | INTRAMUSCULAR | Status: AC
  Administered 2021-07-17: 40 mg via INTRAMUSCULAR

## 2021-07-17 MED ORDER — METHYLPREDNISOLONE 4 MG PO TBPK: ORAL_TABLET | ORAL | 0 refills | Status: DC

## 2021-07-17 MED ORDER — NICOTINE 21 MG/24HR TD PT24: 21.0000 mg | MEDICATED_PATCH | Freq: Every day | TRANSDERMAL | 0 refills | Status: DC

## 2021-07-17 NOTE — Progress Notes (Signed)
BP (!) 172/88    Pulse 66    Temp 99 F (37.2 C) (Oral)    Ht 5' 8.11" (1.73 m)    Wt 184 lb (83.5 kg)    SpO2 100%    BMI 27.89 kg/m    Subjective:    Patient ID: Luke Griffin, male    DOB: October 01, 1960, 61 y.o.   MRN: [id number]  Chief Complaint  Patient presents with   Hypertension    HPI: Luke Griffin is a 61 y.o. male  Hypertension This is a chronic problem. The current episode started more than 1 year ago. The problem has been waxing and waning since onset. The problem is uncontrolled. Associated symptoms include sweats. Pertinent negatives include no anxiety, blurred vision, chest pain, headaches, malaise/fatigue, neck pain, orthopnea, peripheral edema, PND or shortness of breath.  Wheezing  Pertinent negatives include no chest pain, headaches, neck pain or shortness of breath.   Chief Complaint  Patient presents with   Hypertension    Relevant past medical, surgical, family and social history reviewed and updated as indicated. Interim medical history since our last visit reviewed. Allergies and medications reviewed and updated.  Review of Systems  Constitutional:  Negative for malaise/fatigue.  Eyes:  Negative for blurred vision.  Respiratory:  Positive for wheezing. Negative for shortness of breath.   Cardiovascular:  Negative for chest pain, orthopnea and PND.  Musculoskeletal:  Negative for neck pain.  Neurological:  Negative for headaches.   Per HPI unless specifically indicated above     Objective:    BP (!) 172/88    Pulse 66    Temp 99 F (37.2 C) (Oral)    Ht 5' 8.11" (1.73 m)    Wt 184 lb (83.5 kg)    SpO2 100%    BMI 27.89 kg/m   Wt Readings from Last 3 Encounters:  07/17/21 184 lb (83.5 kg)  06/24/21 184 lb (83.5 kg)  06/19/21 184 lb 12.8 oz (83.8 kg)    Physical Exam Vitals and nursing note reviewed.  Constitutional:      General: He is not in acute distress.    Appearance: Normal appearance. He is not ill-appearing or diaphoretic.   HENT:     Head: Normocephalic and atraumatic.     Right Ear: Tympanic membrane and external ear normal. There is no impacted cerumen.     Left Ear: External ear normal.     Nose: No congestion or rhinorrhea.     Mouth/Throat:     Pharynx: No oropharyngeal exudate or posterior oropharyngeal erythema.  Eyes:     Conjunctiva/sclera: Conjunctivae normal.     Pupils: Pupils are equal, round, and reactive to light.  Cardiovascular:     Rate and Rhythm: Normal rate and regular rhythm.     Heart sounds: No murmur heard.   No friction rub. No gallop.  Pulmonary:     Effort: No respiratory distress.     Breath sounds: No stridor. Wheezing present. No rhonchi.       Comments: Wheezing noted ++  Chest:     Chest wall: No tenderness.  Abdominal:     General: Abdomen is flat. Bowel sounds are normal.     Palpations: Abdomen is soft. There is no mass.     Tenderness: There is no abdominal tenderness.  Musculoskeletal:        General: No swelling.     Cervical back: Normal range of motion and neck supple. No rigidity or tenderness.  Left lower leg: No edema.  Skin:    General: Skin is warm and dry.  Neurological:     Mental Status: He is alert.    Results for orders placed or performed in visit on 05/27/21  Bayer DCA Hb A1c Waived (STAT)  Result Value Ref Range   HB A1C (BAYER DCA - WAIVED) 5.8 (H) 4.8 - 5.6 %        Current Outpatient Medications:    albuterol (VENTOLIN HFA) 108 (90 Base) MCG/ACT inhaler, Inhale 2 puffs into the lungs every 6 (six) hours as needed for wheezing or shortness of breath., Disp: 8 g, Rfl: 2   amLODipine (NORVASC) 2.5 MG tablet, Take 1 tablet (2.5 mg total) by mouth daily., Disp: 30 tablet, Rfl: 3   atorvastatin (LIPITOR) 20 MG tablet, Take 1 tablet (20 mg total) by mouth daily., Disp: 30 tablet, Rfl: 3   fluticasone (FLONASE) 50 MCG/ACT nasal spray, Place 2 sprays into both nostrils daily., Disp: 9.9 mL, Rfl: 3   hydrOXYzine (VISTARIL) 25 MG  capsule, Take 1 capsule (25 mg total) by mouth as needed., Disp: 15 capsule, Rfl: 0   lisinopril (ZESTRIL) 40 MG tablet, Take 1 tablet (40 mg total) by mouth daily., Disp: 90 tablet, Rfl: 3   pantoprazole (PROTONIX) 40 MG tablet, Take 1 tablet (40 mg total) by mouth daily., Disp: 30 tablet, Rfl: 1   hydrochlorothiazide (HYDRODIURIL) 25 MG tablet, Take 1 tablet (25 mg total) by mouth daily., Disp: 30 tablet, Rfl: 1    Assessment & Plan:  Wheezing ? Sec to COPD cntinues to smoke however cut back a lot  Continue albuterol  Add anoro   2. Smoking cessation : now smokes 1 pack x 3 days,  Didn't use the patches.  3. URI : will start pt on zpak  ,medrol dose pak Check CXR ? PNA   pt advised to take Tylenol q 4- 6 hourly as needed. pt to take allegra q pm as needed and to call office if symptoms worsened pt verbalised understanding of such.     Problem List Items Addressed This Visit       Cardiovascular and Mediastinum   Primary hypertension - Primary   Relevant Medications   hydrochlorothiazide (HYDRODIURIL) 25 MG tablet   amLODipine (NORVASC) 2.5 MG tablet     No orders of the defined types were placed in this encounter.    Meds ordered this encounter  Medications   hydrochlorothiazide (HYDRODIURIL) 25 MG tablet    Sig: Take 1 tablet (25 mg total) by mouth daily.    Dispense:  30 tablet    Refill:  1   amLODipine (NORVASC) 2.5 MG tablet    Sig: Take 1 tablet (2.5 mg total) by mouth daily.    Dispense:  30 tablet    Refill:  3     Follow up plan: Return in about 1 week (around 07/24/2021).

## 2021-07-24 ENCOUNTER — Telehealth (INDEPENDENT_AMBULATORY_CARE_PROVIDER_SITE_OTHER): Payer: Self-pay | Admitting: Internal Medicine

## 2021-07-24 ENCOUNTER — Encounter: Payer: Self-pay | Admitting: Internal Medicine

## 2021-07-24 VITALS — BP 187/111 | HR 99

## 2021-07-24 DIAGNOSIS — I10 Essential (primary) hypertension: Secondary | ICD-10-CM

## 2021-07-24 MED ORDER — AMLODIPINE BESYLATE 5 MG PO TABS: 5.0000 mg | ORAL_TABLET | Freq: Every day | ORAL | 4 refills | Status: DC

## 2021-07-24 NOTE — Progress Notes (Signed)
BP (!) 187/111    Pulse 99    Subjective:    Patient ID: Luke Griffin, male    DOB: 12-05-1960, 61 y.o.   MRN: [id number]  Chief Complaint  Patient presents with   Hypertension    HPI: Luke Griffin is a 61 y.o. male  Hypertension This is a chronic (bp higher) problem. The current episode started more than 1 year ago. The problem is uncontrolled. Pertinent negatives include no anxiety, blurred vision, chest pain, headaches, malaise/fatigue, neck pain, orthopnea, palpitations, peripheral edema, PND, shortness of breath or sweats.   Chief Complaint  Patient presents with   Hypertension    Relevant past medical, surgical, family and social history reviewed and updated as indicated. Interim medical history since our last visit reviewed. Allergies and medications reviewed and updated.  Review of Systems  Constitutional:  Negative for malaise/fatigue.  Eyes:  Negative for blurred vision.  Respiratory:  Negative for shortness of breath.   Cardiovascular:  Negative for chest pain, palpitations, orthopnea and PND.  Musculoskeletal:  Negative for neck pain.  Neurological:  Negative for headaches.   Per HPI unless specifically indicated above     Objective:    BP (!) 187/111    Pulse 99   Wt Readings from Last 3 Encounters:  07/17/21 184 lb (83.5 kg)  06/24/21 184 lb (83.5 kg)  06/19/21 184 lb 12.8 oz (83.8 kg)    Physical Exam  Results for orders placed or performed in visit on 05/27/21  Bayer DCA Hb A1c Waived (STAT)  Result Value Ref Range   HB A1C (BAYER DCA - WAIVED) 5.8 (H) 4.8 - 5.6 %        Current Outpatient Medications:    albuterol (VENTOLIN HFA) 108 (90 Base) MCG/ACT inhaler, Inhale 2 puffs into the lungs every 6 (six) hours as needed for wheezing or shortness of breath., Disp: 8 g, Rfl: 2   atorvastatin (LIPITOR) 20 MG tablet, Take 1 tablet (20 mg total) by mouth daily., Disp: 30 tablet, Rfl: 3   fluticasone (FLONASE) 50 MCG/ACT nasal spray, Place 2  sprays into both nostrils daily., Disp: 9.9 mL, Rfl: 3   hydrochlorothiazide (HYDRODIURIL) 25 MG tablet, Take 1 tablet (25 mg total) by mouth daily., Disp: 30 tablet, Rfl: 1   hydrOXYzine (VISTARIL) 25 MG capsule, Take 1 capsule (25 mg total) by mouth as needed., Disp: 15 capsule, Rfl: 0   lisinopril (ZESTRIL) 40 MG tablet, Take 1 tablet (40 mg total) by mouth daily., Disp: 90 tablet, Rfl: 3   methylPREDNISolone (MEDROL DOSEPAK) 4 MG TBPK tablet, Use as directed, Disp: 1 each, Rfl: 0   nicotine (NICODERM CQ) 21 mg/24hr patch, Place 1 patch (21 mg total) onto the skin daily., Disp: 28 patch, Rfl: 0   pantoprazole (PROTONIX) 40 MG tablet, Take 1 tablet (40 mg total) by mouth daily., Disp: 30 tablet, Rfl: 1   umeclidinium-vilanterol (ANORO ELLIPTA) 62.5-25 MCG/ACT AEPB, Inhale 1 puff into the lungs daily at 6 (six) AM., Disp: 1 each, Rfl: 4   amLODipine (NORVASC) 5 MG tablet, Take 1 tablet (5 mg total) by mouth daily., Disp: 90 tablet, Rfl: 4    Assessment & Plan:  Htn increase norvasc to 5 mg daily, hctz 25 untis, lisinopril 40 mg  Continue current meds.  Medication compliance emphasised. pt advised to keep Bp logs. Pt verbalised understanding of the same. Pt to have a low salt diet . Exercise to reach a goal of at least 150 mins a week.  lifestyle modifications explained and pt understands importance of the above. Under good control on current regimen. Continue current regimen. Continue to monitor. Call with any concerns. Refills given. Labs drawn today.   Smoking cessation is smoking 1 pack in 3 days Problem List Items Addressed This Visit       Cardiovascular and Mediastinum   Primary hypertension - Primary   Relevant Medications   amLODipine (NORVASC) 5 MG tablet     No orders of the defined types were placed in this encounter.    Meds ordered this encounter  Medications   amLODipine (NORVASC) 5 MG tablet    Sig: Take 1 tablet (5 mg total) by mouth daily.    Dispense:  90 tablet     Refill:  4     Follow up plan: No follow-ups on file.

## 2021-07-29 ENCOUNTER — Ambulatory Visit: Payer: Self-pay | Admitting: Internal Medicine

## 2021-07-29 ENCOUNTER — Telehealth: Payer: Self-pay | Admitting: Internal Medicine

## 2021-07-29 NOTE — Telephone Encounter (Signed)
Copied from CRM [id number]. Topic: General - Other >> Jul 29, 2021 11:05 AM Gaetana Michaelis A wrote: Reason for CRM: The patient has called to share their BP readings   07/26/21 AM 159/89 PM 133/90 07/27/21 AM 149/85 PM 137/90 07/28/21 AM 129/82 PM 132/80  07/29/21 AM 132/85   Please contact further when possible

## 2021-08-20 ENCOUNTER — Other Ambulatory Visit: Payer: Self-pay | Admitting: Internal Medicine

## 2021-08-21 NOTE — Telephone Encounter (Signed)
Requested Prescriptions  ?Pending Prescriptions Disp Refills  ?? albuterol (VENTOLIN HFA) 108 (90 Base) MCG/ACT inhaler [Pharmacy Med Name: Albuterol Sulfate HFA 108 (90 Base) MCG/ACT Inhalation Aerosol Solution] 9 g 0  ?  Sig: INHALE 2 PUFFS BY MOUTH EVERY 6 HOURS AS NEEDED FOR WHEEZING FOR SHORTNESS OF BREATH  ?  ? Pulmonology:  Beta Agonists 2 Failed - 08/20/2021  1:03 PM  ?  ?  Failed - Last BP in normal range  ?  BP Readings from Last 1 Encounters:  ?07/24/21 (!) 187/111  ?   ?  ?  Passed - Last Heart Rate in normal range  ?  Pulse Readings from Last 1 Encounters:  ?07/24/21 99  ?   ?  ?  Passed - Valid encounter within last 12 months  ?  Recent Outpatient Visits   ?      ? 4 weeks ago Primary hypertension  ? [facility] Vigg, Avanti, MD  ? 1 month ago Primary hypertension  ? [facility] Vigg, Avanti, MD  ? 1 month ago Primary hypertension  ? [facility] Vigg, Avanti, MD  ? 2 months ago Primary hypertension  ? [facility] Vigg, Avanti, MD  ? 2 months ago Diverticulitis  ? [facility] Vigg, Avanti, MD  ?  ?  ?Future Appointments   ?        ? In 1 month Vigg, Avanti, MD [facility], [facility]  ?  ? ?  ?  ?  ? ? ?

## 2021-08-26 DIAGNOSIS — I10 Essential (primary) hypertension: Secondary | ICD-10-CM | POA: Insufficient documentation

## 2021-08-29 ENCOUNTER — Other Ambulatory Visit: Payer: Self-pay | Admitting: Internal Medicine

## 2021-08-29 MED ORDER — HYDROXYZINE PAMOATE 25 MG PO CAPS: 25.0000 mg | ORAL_CAPSULE | ORAL | 0 refills | Status: DC | PRN

## 2021-08-29 NOTE — Telephone Encounter (Signed)
Medication Refill - Medication: Hydroxine 25 mg ? ?Has the patient contacted their pharmacy? No.no refills ?(Agent: If no, request that the patient contact the pharmacy for the refill. If patient does not wish to contact the pharmacy document the reason why and proceed with request.) ?(Agent: If yes, when and what did the pharmacy advise?) ? ?Preferred Pharmacy (with phone number or street name): [facility] [address] ?Has the patient been seen for an appointment in the last year OR does the patient have an upcoming appointment? Yes.   ? ?Agent: Please be advised that RX refills may take up to 3 business days. We ask that you follow-up with your pharmacy.  ?

## 2021-08-29 NOTE — Telephone Encounter (Signed)
Requested Prescriptions  ?Pending Prescriptions Disp Refills  ?? hydrOXYzine (VISTARIL) 25 MG capsule 15 capsule 0  ?  Sig: Take 1 capsule (25 mg total) by mouth as needed.  ?  ? Ear, Nose, and Throat:  Antihistamines 2 Passed - 08/29/2021 10:05 AM  ?  ?  Passed - Cr in normal range and within 360 days  ?  Creatinine, Ser  ?Date Value Ref Range Status  ?05/24/2021 0.97 0.61 - 1.24 mg/dL Final  ?   ?  ?  Passed - Valid encounter within last 12 months  ?  Recent Outpatient Visits   ?      ? 1 month ago Primary hypertension  ? [facility] Vigg, Avanti, MD  ? 1 month ago Primary hypertension  ? [facility] Vigg, Avanti, MD  ? 2 months ago Primary hypertension  ? [facility] Vigg, Avanti, MD  ? 2 months ago Primary hypertension  ? [facility] Vigg, Avanti, MD  ? 3 months ago Diverticulitis  ? [facility] Vigg, Avanti, MD  ?  ?  ?Future Appointments   ?        ? In 1 month Vigg, Avanti, MD [facility], [facility]  ?  ? ?  ?  ?  ? ?

## 2021-09-16 ENCOUNTER — Other Ambulatory Visit: Payer: Self-pay | Admitting: Internal Medicine

## 2021-09-17 NOTE — Telephone Encounter (Signed)
Requested Prescriptions  ?Pending Prescriptions Disp Refills  ?? hydrochlorothiazide (HYDRODIURIL) 25 MG tablet [Pharmacy Med Name: hydroCHLOROthiazide 25 MG Oral Tablet] 30 tablet 0  ?  Sig: Take 1 tablet by mouth once daily  ?  ? Cardiovascular: Diuretics - Thiazide Failed - 09/16/2021 10:09 AM  ?  ?  Failed - K in normal range and within 180 days  ?  Potassium  ?Date Value Ref Range Status  ?05/24/2021 3.4 (L) 3.5 - 5.1 mmol/L Final  ?   ?  ?  Failed - Last BP in normal range  ?  BP Readings from Last 1 Encounters:  ?07/24/21 (!) 187/111  ?   ?  ?  Passed - Cr in normal range and within 180 days  ?  Creatinine, Ser  ?Date Value Ref Range Status  ?05/24/2021 0.97 0.61 - 1.24 mg/dL Final  ?   ?  ?  Passed - Na in normal range and within 180 days  ?  Sodium  ?Date Value Ref Range Status  ?05/24/2021 135 135 - 145 mmol/L Final  ?05/17/2021 141 134 - 144 mmol/L Final  ?   ?  ?  Passed - Valid encounter within last 6 months  ?  Recent Outpatient Visits   ?      ? 1 month ago Primary hypertension  ? [facility] Vigg, Avanti, MD  ? 2 months ago Primary hypertension  ? [facility] Vigg, Avanti, MD  ? 2 months ago Primary hypertension  ? [facility] Vigg, Avanti, MD  ? 3 months ago Primary hypertension  ? [facility] Vigg, Avanti, MD  ? 3 months ago Diverticulitis  ? [facility] Vigg, Avanti, MD  ?  ?  ?Future Appointments   ?        ? In 1 month Vigg, Avanti, MD [facility]  ?  ? ?  ?  ?  ? ? ?

## 2021-10-17 ENCOUNTER — Ambulatory Visit: Payer: Self-pay | Admitting: Internal Medicine

## 2021-11-05 ENCOUNTER — Ambulatory Visit: Payer: Self-pay | Admitting: Internal Medicine

## 2021-11-12 ENCOUNTER — Ambulatory Visit: Payer: Self-pay | Admitting: Internal Medicine

## 2021-11-19 ENCOUNTER — Ambulatory Visit (INDEPENDENT_AMBULATORY_CARE_PROVIDER_SITE_OTHER): Payer: Self-pay | Admitting: Internal Medicine

## 2021-11-19 ENCOUNTER — Encounter: Payer: Self-pay | Admitting: Internal Medicine

## 2021-11-19 DIAGNOSIS — I10 Essential (primary) hypertension: Secondary | ICD-10-CM

## 2021-11-19 DIAGNOSIS — Z13220 Encounter for screening for lipoid disorders: Secondary | ICD-10-CM

## 2021-11-19 DIAGNOSIS — N529 Male erectile dysfunction, unspecified: Secondary | ICD-10-CM

## 2021-11-19 LAB — URINALYSIS, ROUTINE W REFLEX MICROSCOPIC
Bilirubin, UA: NEGATIVE
Glucose, UA: NEGATIVE
Ketones, UA: NEGATIVE
Leukocytes,UA: NEGATIVE
Nitrite, UA: NEGATIVE
Protein,UA: NEGATIVE
RBC, UA: NEGATIVE
Specific Gravity, UA: >1.03 — ABNORMAL HIGH (ref 1.005–1.030)
Urobilinogen, Ur: 0.2 mg/dL (ref 0.2–1.0)
pH, UA: 5.5 (ref 5.0–7.5)

## 2021-11-19 MED ORDER — HYDROXYZINE PAMOATE 25 MG PO CAPS: 25.0000 mg | ORAL_CAPSULE | ORAL | 0 refills | Status: DC | PRN

## 2021-11-19 MED ORDER — HYDROCHLOROTHIAZIDE 25 MG PO TABS: 25.0000 mg | ORAL_TABLET | Freq: Every day | ORAL | 2 refills | Status: DC

## 2021-11-19 NOTE — Progress Notes (Signed)
BP (!) 154/98 Comment: Manual  Pulse 78   Temp 98.4 F (36.9 C) (Oral)   Ht 5' 8.11" (1.73 m)   Wt 189 lb 3.2 oz (85.8 kg)   SpO2 96%   BMI 28.67 kg/m    Subjective:    Patient ID: Luke Griffin, male    DOB: 21-Mar-1961, 61 y.o.   MRN: [id number]  Chief Complaint  Patient presents with  . Hypertension  . Hyperlipidemia  . Erectile Dysfunction    HPI: Luke Griffin is a 61 y.o. male  Hypertension This is a chronic problem. The current episode started more than 1 year ago. The problem has been gradually improving since onset. The problem is controlled. Pertinent negatives include no anxiety, blurred vision, chest pain, headaches, malaise/fatigue, neck pain, orthopnea, palpitations, peripheral edema, shortness of breath or sweats. There is no history of chronic renal disease.  Hyperlipidemia This is a chronic problem. The current episode started more than 1 year ago. The problem is uncontrolled. He has no history of chronic renal disease, hypothyroidism or obesity. Pertinent negatives include no chest pain or shortness of breath.    Chief Complaint  Patient presents with  . Hypertension  . Hyperlipidemia  . Erectile Dysfunction    Relevant past medical, surgical, family and social history reviewed and updated as indicated. Interim medical history since our last visit reviewed. Allergies and medications reviewed and updated.  Review of Systems  Constitutional:  Negative for malaise/fatigue.  Eyes:  Negative for blurred vision.  Respiratory:  Negative for shortness of breath.   Cardiovascular:  Negative for chest pain, palpitations and orthopnea.  Musculoskeletal:  Negative for neck pain.  Neurological:  Negative for headaches.   Per HPI unless specifically indicated above     Objective:    BP (!) 154/98 Comment: Manual  Pulse 78   Temp 98.4 F (36.9 C) (Oral)   Ht 5' 8.11" (1.73 m)   Wt 189 lb 3.2 oz (85.8 kg)   SpO2 96%   BMI 28.67 kg/m   Wt Readings  from Last 3 Encounters:  11/19/21 189 lb 3.2 oz (85.8 kg)  07/17/21 184 lb (83.5 kg)  06/24/21 184 lb (83.5 kg)    Physical Exam Vitals and nursing note reviewed.  Constitutional:      General: He is not in acute distress.    Appearance: Normal appearance. He is not ill-appearing or diaphoretic.  HENT:     Head: Normocephalic and atraumatic.     Right Ear: Tympanic membrane and external ear normal. There is no impacted cerumen.     Left Ear: External ear normal.     Nose: No congestion or rhinorrhea.     Mouth/Throat:     Pharynx: No oropharyngeal exudate or posterior oropharyngeal erythema.  Eyes:     Conjunctiva/sclera: Conjunctivae normal.     Pupils: Pupils are equal, round, and reactive to light.  Cardiovascular:     Rate and Rhythm: Normal rate and regular rhythm.     Heart sounds: No murmur heard.   No friction rub. No gallop.  Pulmonary:     Effort: No respiratory distress.     Breath sounds: No stridor. No wheezing or rhonchi.  Chest:     Chest wall: No tenderness.  Abdominal:     General: Abdomen is flat. Bowel sounds are normal.     Palpations: Abdomen is soft. There is no mass.     Tenderness: There is no abdominal tenderness.  Musculoskeletal:  Cervical back: Normal range of motion and neck supple. No rigidity or tenderness.     Left lower leg: No edema.  Skin:    General: Skin is warm and dry.     Coloration: Skin is not jaundiced or pale.  Neurological:     Mental Status: He is alert.     Cranial Nerves: No cranial nerve deficit.     Sensory: No sensory deficit.     Motor: No weakness.     Coordination: Coordination normal.     Deep Tendon Reflexes: Reflexes normal.    Results for orders placed or performed in visit on 05/27/21  Bayer DCA Hb A1c Waived (STAT)  Result Value Ref Range   HB A1C (BAYER DCA - WAIVED) 5.8 (H) 4.8 - 5.6 %        Current Outpatient Medications:  .  albuterol (VENTOLIN HFA) 108 (90 Base) MCG/ACT inhaler, INHALE 2  PUFFS BY MOUTH EVERY 6 HOURS AS NEEDED FOR WHEEZING FOR SHORTNESS OF BREATH, Disp: 8 g, Rfl: 2 .  amLODipine (NORVASC) 5 MG tablet, Take 1 tablet (5 mg total) by mouth daily., Disp: 90 tablet, Rfl: 4 .  atorvastatin (LIPITOR) 20 MG tablet, Take 1 tablet (20 mg total) by mouth daily., Disp: 30 tablet, Rfl: 3 .  fluticasone (FLONASE) 50 MCG/ACT nasal spray, Place 2 sprays into both nostrils daily., Disp: 9.9 mL, Rfl: 3 .  hydrochlorothiazide (HYDRODIURIL) 25 MG tablet, Take 1 tablet by mouth once daily, Disp: 90 tablet, Rfl: 0 .  hydrOXYzine (VISTARIL) 25 MG capsule, Take 1 capsule (25 mg total) by mouth as needed., Disp: 15 capsule, Rfl: 0 .  lisinopril (ZESTRIL) 40 MG tablet, Take 1 tablet (40 mg total) by mouth daily., Disp: 90 tablet, Rfl: 3 .  pantoprazole (PROTONIX) 40 MG tablet, Take 1 tablet (40 mg total) by mouth daily., Disp: 30 tablet, Rfl: 1 .  umeclidinium-vilanterol (ANORO ELLIPTA) 62.5-25 MCG/ACT AEPB, Inhale 1 puff into the lungs daily at 6 (six) AM., Disp: 1 each, Rfl: 4    Assessment & Plan:  HTN : is on hctz , amlodipine and lisinopril for such Continue current meds.  Medication compliance emphasised. pt advised to keep Bp logs. Pt verbalised understanding of the same. Pt to have a low salt diet . Exercise to reach a goal of at least 150 mins a week.  lifestyle modifications explained and pt understands importance of the above. Under good control on current regimen. Continue current regimen. Continue to monitor. Call with any concerns. Refills given. Labs drawn today.   2. HLD is on lipitor for such  recheck FLP, check LFT's work on diet, SE of meds explained to pt. low fat and high fiber diet explained to pt.  Problem List Items Addressed This Visit       Cardiovascular and Mediastinum   Primary hypertension     Other   Screening for lipid disorders   Erectile dysfunction     No orders of the defined types were placed in this encounter.    No orders of the defined  types were placed in this encounter.    Follow up plan: No follow-ups on file.

## 2021-11-20 ENCOUNTER — Other Ambulatory Visit: Payer: Self-pay | Admitting: Internal Medicine

## 2021-11-20 LAB — COMPREHENSIVE METABOLIC PANEL
ALT: 34 IU/L (ref 0–44)
AST: 24 IU/L (ref 0–40)
Albumin/Globulin Ratio: 2.3 — ABNORMAL HIGH (ref 1.2–2.2)
Albumin: 4.8 g/dL (ref 3.8–4.9)
Alkaline Phosphatase: 80 IU/L (ref 44–121)
BUN/Creatinine Ratio: 13 (ref 9–20)
BUN: 12 mg/dL (ref 6–24)
Bilirubin Total: 0.4 mg/dL (ref 0.0–1.2)
CO2: 22 mmol/L (ref 20–29)
Calcium: 9.4 mg/dL (ref 8.7–10.2)
Chloride: 101 mmol/L (ref 96–106)
Creatinine, Ser: 0.94 mg/dL (ref 0.76–1.27)
Globulin, Total: 2.1 g/dL (ref 1.5–4.5)
Glucose: 109 mg/dL — ABNORMAL HIGH (ref 70–99)
Potassium: 3.6 mmol/L (ref 3.5–5.2)
Sodium: 140 mmol/L (ref 134–144)
Total Protein: 6.9 g/dL (ref 6.0–8.5)
eGFR: 94 mL/min/{1.73_m2} (ref >59)

## 2021-11-20 LAB — LIPID PANEL
Chol/HDL Ratio: 4.8 ratio (ref 0.0–5.0)
Cholesterol, Total: 205 mg/dL — ABNORMAL HIGH (ref 100–199)
HDL: 43 mg/dL (ref >39)
LDL Chol Calc (NIH): 138 mg/dL — ABNORMAL HIGH (ref 0–99)
Triglycerides: 132 mg/dL (ref 0–149)
VLDL Cholesterol Cal: 24 mg/dL (ref 5–40)

## 2021-11-20 MED ORDER — ATORVASTATIN CALCIUM 40 MG PO TABS: 40.0000 mg | ORAL_TABLET | Freq: Every day | ORAL | 4 refills | Status: DC

## 2021-11-20 NOTE — Progress Notes (Signed)
Increase dose of lipitor to 40 mg. Senidn to pharmacy LDL still high , needs to exercise 150 mins a week and eat high fibre/ low fat diet. Please let pt know thnx.

## 2021-11-26 ENCOUNTER — Other Ambulatory Visit: Payer: Self-pay

## 2022-02-25 ENCOUNTER — Encounter: Payer: Self-pay | Admitting: Unknown Physician Specialty

## 2022-02-25 ENCOUNTER — Ambulatory Visit (INDEPENDENT_AMBULATORY_CARE_PROVIDER_SITE_OTHER): Payer: Self-pay | Admitting: Unknown Physician Specialty

## 2022-02-25 DIAGNOSIS — E785 Hyperlipidemia, unspecified: Secondary | ICD-10-CM | POA: Insufficient documentation

## 2022-02-25 DIAGNOSIS — I10 Essential (primary) hypertension: Secondary | ICD-10-CM

## 2022-02-25 MED ORDER — LISINOPRIL 40 MG PO TABS: 40.0000 mg | ORAL_TABLET | Freq: Every day | ORAL | 3 refills | Status: DC

## 2022-02-25 MED ORDER — TADALAFIL 5 MG PO TABS: 5.0000 mg | ORAL_TABLET | Freq: Every day | ORAL | 12 refills | Status: DC

## 2022-02-25 MED ORDER — HYDROXYZINE PAMOATE 25 MG PO CAPS: 25.0000 mg | ORAL_CAPSULE | ORAL | 0 refills | Status: DC | PRN

## 2022-02-25 MED ORDER — METOPROLOL SUCCINATE ER 50 MG PO TB24: 50.0000 mg | ORAL_TABLET | Freq: Every day | ORAL | 3 refills | Status: DC

## 2022-02-25 NOTE — Progress Notes (Signed)
BP (!) 146/91 (BP Location: Left Arm, Cuff Size: Normal)   Pulse 73   Temp 99 F (37.2 C) (Oral)   Wt 189 lb 6.4 oz (85.9 kg)   SpO2 97%   BMI 28.71 kg/m    Subjective:    Patient ID: Luke Griffin, male    DOB: 19-Feb-1961, 61 y.o.   MRN: [id number]  HPI: Luke Griffin is a 61 y.o. male  Chief Complaint  Patient presents with   Hyperlipidemia   Hypertension   Hypertension Using medications without difficulty - Taking HCTZ, Amlodipine, and Lisinopril.  States that his BP has been high ever since his last Covid booster.   Average home BPs: 150/83-84 is typical.  States he has had BP at this level much of his life.     No problems or lightheadedness No chest pain with exertion or shortness of breath No Edema  States he doesn't snore and sleeps well.  No OTC meds.  States he does not drink any alcohol.     Hyperlipidemia Taking Atorvastaten but did not take it with last visit  Using medications without problems: No Muscle aches   Anxiety Takes the occasional hydroxyzine which helps     02/25/2022   10:48 AM 11/19/2021   11:13 AM 07/24/2021    2:27 PM 07/17/2021    4:05 PM 06/24/2021    4:04 PM  Depression screen PHQ 2/9  Decreased Interest 0 0 0 0 0  Down, Depressed, Hopeless 0 0 0 0 0  PHQ - 2 Score 0 0 0 0 0  Altered sleeping 2 0 2 2 0  Tired, decreased energy 1 0 $R'1 1 1  'HJ$ Change in appetite 0 0 0 0 0  Feeling bad or failure about yourself  0 0 0 0 0  Trouble concentrating 0 0 0 0 0  Moving slowly or fidgety/restless 0 0 0 0 0  Suicidal thoughts 0 0 0 0 0  PHQ-9 Score 3 0 $R'3 3 1  'id$ Difficult doing work/chores Somewhat difficult Not difficult at all Not difficult at all  Not difficult at all     Relevant past medical, surgical, family and social history reviewed and updated as indicated. Interim medical history since our last visit reviewed. Allergies and medications reviewed and updated.  Review of Systems  Per HPI unless specifically indicated above      Objective:    BP (!) 146/91 (BP Location: Left Arm, Cuff Size: Normal)   Pulse 73   Temp 99 F (37.2 C) (Oral)   Wt 189 lb 6.4 oz (85.9 kg)   SpO2 97%   BMI 28.71 kg/m   Wt Readings from Last 3 Encounters:  02/25/22 189 lb 6.4 oz (85.9 kg)  11/19/21 189 lb 3.2 oz (85.8 kg)  07/17/21 184 lb (83.5 kg)    Physical Exam Constitutional:      General: He is not in acute distress.    Appearance: Normal appearance. He is well-developed.  HENT:     Head: Normocephalic and atraumatic.  Eyes:     General: Lids are normal. No scleral icterus.       Right eye: No discharge.        Left eye: No discharge.     Conjunctiva/sclera: Conjunctivae normal.  Neck:     Vascular: No carotid bruit or JVD.  Cardiovascular:     Rate and Rhythm: Normal rate and regular rhythm.     Heart sounds: Normal heart sounds.  Pulmonary:  Effort: Pulmonary effort is normal. No respiratory distress.     Breath sounds: Normal breath sounds.  Abdominal:     Palpations: There is no hepatomegaly or splenomegaly.  Musculoskeletal:        General: Normal range of motion.     Cervical back: Normal range of motion and neck supple.  Skin:    General: Skin is warm and dry.     Coloration: Skin is not pale.     Findings: No rash.  Neurological:     Mental Status: He is alert and oriented to person, place, and time.  Psychiatric:        Behavior: Behavior normal.        Thought Content: Thought content normal.        Judgment: Judgment normal.     Results for orders placed or performed in visit on 11/19/21  Comprehensive metabolic panel  Result Value Ref Range   Glucose 109 (H) 70 - 99 mg/dL   BUN 12 6 - 24 mg/dL   Creatinine, Ser 0.94 0.76 - 1.27 mg/dL   eGFR 94 >59 mL/min/1.73   BUN/Creatinine Ratio 13 9 - 20   Sodium 140 134 - 144 mmol/L   Potassium 3.6 3.5 - 5.2 mmol/L   Chloride 101 96 - 106 mmol/L   CO2 22 20 - 29 mmol/L   Calcium 9.4 8.7 - 10.2 mg/dL   Total Protein 6.9 6.0 - 8.5 g/dL    Albumin 4.8 3.8 - 4.9 g/dL   Globulin, Total 2.1 1.5 - 4.5 g/dL   Albumin/Globulin Ratio 2.3 (H) 1.2 - 2.2   Bilirubin Total 0.4 0.0 - 1.2 mg/dL   Alkaline Phosphatase 80 44 - 121 IU/L   AST 24 0 - 40 IU/L   ALT 34 0 - 44 IU/L  Urinalysis, Routine w reflex microscopic  Result Value Ref Range   Specific Gravity, UA >1.030 (H) 1.005 - 1.030   pH, UA 5.5 5.0 - 7.5   Color, UA Yellow Yellow   Appearance Ur Clear Clear   Leukocytes,UA Negative Negative   Protein,UA Negative Negative/Trace   Glucose, UA Negative Negative   Ketones, UA Negative Negative   RBC, UA Negative Negative   Bilirubin, UA Negative Negative   Urobilinogen, Ur 0.2 0.2 - 1.0 mg/dL   Nitrite, UA Negative Negative  Lipid panel  Result Value Ref Range   Cholesterol, Total 205 (H) 100 - 199 mg/dL   Triglycerides 132 0 - 149 mg/dL   HDL 43 >39 mg/dL   VLDL Cholesterol Cal 24 5 - 40 mg/dL   LDL Chol Calc (NIH) 138 (H) 0 - 99 mg/dL   Chol/HDL Ratio 4.8 0.0 - 5.0 ratio      Assessment & Plan:   Problem List Items Addressed This Visit       Unprioritized   Uncontrolled hypertension    Poorly controlled BP despite triple therapy.  Pt lacks good insurance and not wanting to go to the hypertension clinic.  Start Metoprolol      Relevant Medications   metoprolol succinate (TOPROL-XL) 50 MG 24 hr tablet   lisinopril (ZESTRIL) 40 MG tablet   tadalafil (CIALIS) 5 MG tablet      Follow up plan: Return in about 4 weeks (around 03/25/2022).

## 2022-02-25 NOTE — Assessment & Plan Note (Addendum)
Poorly controlled BP despite triple therapy.  Pt lacks good insurance and not wanting to go to the hypertension clinic.  Start Metoprolol

## 2022-03-27 ENCOUNTER — Ambulatory Visit (INDEPENDENT_AMBULATORY_CARE_PROVIDER_SITE_OTHER): Payer: Self-pay | Admitting: Nurse Practitioner

## 2022-03-27 ENCOUNTER — Ambulatory Visit: Payer: Self-pay | Admitting: Unknown Physician Specialty

## 2022-03-27 ENCOUNTER — Encounter: Payer: Self-pay | Admitting: Nurse Practitioner

## 2022-03-27 VITALS — BP 150/90 | HR 73 | Temp 98.2°F | Wt 190.3 lb

## 2022-03-27 DIAGNOSIS — I10 Essential (primary) hypertension: Secondary | ICD-10-CM

## 2022-03-27 DIAGNOSIS — J209 Acute bronchitis, unspecified: Secondary | ICD-10-CM

## 2022-03-27 MED ORDER — CEFTRIAXONE SODIUM 500 MG IJ SOLR
500.0000 mg | Freq: Once | INTRAMUSCULAR | Status: AC
  Administered 2022-03-27: 500 mg via INTRAMUSCULAR

## 2022-03-27 MED ORDER — PREDNISONE 10 MG PO TABS: 10.0000 mg | ORAL_TABLET | ORAL | 0 refills | Status: DC

## 2022-03-27 MED ORDER — VALSARTAN 160 MG PO TABS: 160.0000 mg | ORAL_TABLET | Freq: Every day | ORAL | 0 refills | Status: DC

## 2022-03-27 NOTE — Assessment & Plan Note (Signed)
Chronic. Not well controlled.  Has stopped Amlodipine due to it giving him chest pain.  Tolerating new Metoprolol well.  Will change from Lisinopril to Valsartan 160mg . Side effects and benefits of medication discussed during visit. Follow up in 6 weeks.  Will check labs at next visit.

## 2022-03-27 NOTE — Progress Notes (Signed)
BP (!) 150/90   Pulse 73   Temp 98.2 F (36.8 C) (Oral)   Wt 190 lb 4.8 oz (86.3 kg)   SpO2 95%   BMI 28.84 kg/m    Subjective:    Patient ID: Luke Griffin, male    DOB: [birth year], 61 y.o.   MRN: [id number]  HPI: Luke Griffin is a 61 y.o. male  Chief Complaint  Patient presents with   Cough    Patient reports cough onset 3 days ago, reports burning sensation in chest d/t cough. Pt feels like he may have bronchitis.    UPPER RESPIRATORY TRACT INFECTION Worst symptom: 3 days Fever: no Cough: yes Shortness of breath: yes Wheezing: yes Chest pain: yes, with cough Chest tightness: no Chest congestion: yes Nasal congestion: no Runny nose: no Post nasal drip: yes Sneezing: no Sore throat: no Swollen glands: no Sinus pressure: no Headache: no Face pain: no Toothache: no Ear pain: no bilateral Ear pressure: no bilateral Eyes red/itching:no Eye drainage/crusting: no  Vomiting: no Rash: no Fatigue: yes Sick contacts: no Strep contacts: no  Context: worse Recurrent sinusitis: no Relief with OTC cold/cough medications: no  Treatments attempted: none  Has cut down to 1/2 ppd.   Relevant past medical, surgical, family and social history reviewed and updated as indicated. Interim medical history since our last visit reviewed. Allergies and medications reviewed and updated.  Review of Systems  Constitutional:  Positive for fatigue. Negative for fever.  HENT:  Positive for congestion and postnasal drip. Negative for ear pain, rhinorrhea, sinus pressure, sinus pain, sneezing and sore throat.   Respiratory:  Positive for cough, shortness of breath and wheezing. Negative for chest tightness.   Gastrointestinal:  Negative for vomiting.  Skin:  Negative for rash.  Neurological:  Negative for headaches.    Per HPI unless specifically indicated above     Objective:    BP (!) 150/90   Pulse 73   Temp 98.2 F (36.8 C) (Oral)   Wt 190 lb 4.8 oz (86.3 kg)    SpO2 95%   BMI 28.84 kg/m   Wt Readings from Last 3 Encounters:  03/27/22 190 lb 4.8 oz (86.3 kg)  02/25/22 189 lb 6.4 oz (85.9 kg)  11/19/21 189 lb 3.2 oz (85.8 kg)    Physical Exam Vitals and nursing note reviewed.  Constitutional:      General: He is not in acute distress.    Appearance: Normal appearance. He is not ill-appearing, toxic-appearing or diaphoretic.  HENT:     Head: Normocephalic.     Right Ear: External ear normal.     Left Ear: External ear normal.     Nose: Nose normal. No congestion or rhinorrhea.     Mouth/Throat:     Mouth: Mucous membranes are moist.  Eyes:     General:        Right eye: No discharge.        Left eye: No discharge.     Extraocular Movements: Extraocular movements intact.     Conjunctiva/sclera: Conjunctivae normal.     Pupils: Pupils are equal, round, and reactive to light.  Cardiovascular:     Rate and Rhythm: Normal rate and regular rhythm.     Heart sounds: No murmur heard. Pulmonary:     Effort: Pulmonary effort is normal. No respiratory distress.     Breath sounds: Examination of the right-upper field reveals wheezing. Examination of the right-lower field reveals wheezing. Wheezing present. No decreased breath sounds, rhonchi  or rales.  Abdominal:     General: Abdomen is flat. Bowel sounds are normal.  Musculoskeletal:     Cervical back: Normal range of motion and neck supple.  Skin:    General: Skin is warm and dry.     Capillary Refill: Capillary refill takes less than 2 seconds.  Neurological:     General: No focal deficit present.     Mental Status: He is alert and oriented to person, place, and time.  Psychiatric:        Mood and Affect: Mood normal.        Behavior: Behavior normal.        Thought Content: Thought content normal.        Judgment: Judgment normal.     Results for orders placed or performed in visit on 11/19/21  Comprehensive metabolic panel  Result Value Ref Range   Glucose 109 (H) 70 - 99 mg/dL    BUN 12 6 - 24 mg/dL   Creatinine, Ser 5.45 0.76 - 1.27 mg/dL   eGFR 94 >70 SN/VNV/0.55   BUN/Creatinine Ratio 13 9 - 20   Sodium 140 134 - 144 mmol/L   Potassium 3.6 3.5 - 5.2 mmol/L   Chloride 101 96 - 106 mmol/L   CO2 22 20 - 29 mmol/L   Calcium 9.4 8.7 - 10.2 mg/dL   Total Protein 6.9 6.0 - 8.5 g/dL   Albumin 4.8 3.8 - 4.9 g/dL   Globulin, Total 2.1 1.5 - 4.5 g/dL   Albumin/Globulin Ratio 2.3 (H) 1.2 - 2.2   Bilirubin Total 0.4 0.0 - 1.2 mg/dL   Alkaline Phosphatase 80 44 - 121 IU/L   AST 24 0 - 40 IU/L   ALT 34 0 - 44 IU/L  Urinalysis, Routine w reflex microscopic  Result Value Ref Range   Specific Gravity, UA >1.030 (H) 1.005 - 1.030   pH, UA 5.5 5.0 - 7.5   Color, UA Yellow Yellow   Appearance Ur Clear Clear   Leukocytes,UA Negative Negative   Protein,UA Negative Negative/Trace   Glucose, UA Negative Negative   Ketones, UA Negative Negative   RBC, UA Negative Negative   Bilirubin, UA Negative Negative   Urobilinogen, Ur 0.2 0.2 - 1.0 mg/dL   Nitrite, UA Negative Negative  Lipid panel  Result Value Ref Range   Cholesterol, Total 205 (H) 100 - 199 mg/dL   Triglycerides 821 0 - 149 mg/dL   HDL 43 >07 mg/dL   VLDL Cholesterol Cal 24 5 - 40 mg/dL   LDL Chol Calc (NIH) 446 (H) 0 - 99 mg/dL   Chol/HDL Ratio 4.8 0.0 - 5.0 ratio      Assessment & Plan:   Problem List Items Addressed This Visit       Cardiovascular and Mediastinum   Primary hypertension    Chronic. Not well controlled.  Has stopped Amlodipine due to it giving him chest pain.  Tolerating new Metoprolol well.  Will change from Lisinopril to Valsartan 160mg . Side effects and benefits of medication discussed during visit. Follow up in 6 weeks.  Will check labs at next visit.      Relevant Medications   valsartan (DIOVAN) 160 MG tablet   Other Visit Diagnoses     Acute bronchitis, unspecified organism    -  Primary   Suspect underlying COPD. No confirmed dx due to insurance. Rocephin has worked well  for patient in the past. Will also treat with Prednisone. FU if not improved  Relevant Medications   cefTRIAXone (ROCEPHIN) injection 500 mg (Start on 03/27/2022 10:45 AM)        Follow up plan: Return in about 6 weeks (around 05/08/2022) for HTN, HLD, DM2 FU.

## 2022-04-14 ENCOUNTER — Ambulatory Visit: Payer: Self-pay

## 2022-04-14 NOTE — Telephone Encounter (Signed)
  Chief Complaint: dry cough Symptoms: had several weeks Frequency: very frequent Pertinent Negatives: Patient denies fever, productive cough Disposition: [] ED /[] Urgent Care (no appt availability in office) / [x] Appointment(In office/virtual)/ []  [facility]/ [] Home Care/ [] Refused Recommended Disposition /[] [facility]/ []  Follow-up with PCP Additional Notes: Appt made, pt wants shot of prednisone. Home Care discussed. On wait list.  Reason for Disposition  Cough has been present for > 3 weeks  Answer Assessment - Initial Assessment Questions 1. ONSET: "When did the cough begin?"      Took prednison and cough was better but now it is back and prednisone is finished 2. SEVERITY: "How bad is the cough today?"      Dry non productive cough, frequent, can not sleep 3. SPUTUM: "Describe the color of your sputum" (none, dry cough; clear, white, yellow, green)     no 4. HEMOPTYSIS: "Are you coughing up any blood?" If so ask: "How much?" (flecks, streaks, tablespoons, etc.)     no 5. DIFFICULTY BREATHING: "Are you having difficulty breathing?" If Yes, ask: "How bad is it?" (e.g., mild, moderate, severe)    - MILD: No SOB at rest, mild SOB with walking, speaks normally in sentences, can lie down, no retractions, pulse < 100.    - MODERATE: SOB at rest, SOB with minimal exertion and prefers to sit, cannot lie down flat, speaks in phrases, mild retractions, audible wheezing, pulse 100-120.    - SEVERE: Very SOB at rest, speaks in single words, struggling to breathe, sitting hunched forward, retractions, pulse > 120      A little sob 6. FEVER: "Do you have a fever?" If Yes, ask: "What is your temperature, how was it measured, and when did it start?"     no 7. CARDIAC HISTORY: "Do you have any history of heart disease?" (e.g., heart attack, congestive heart failure)      No, bp med 8. LUNG HISTORY: "Do you have any history of lung disease?"  (e.g., pulmonary embolus, asthma,  emphysema)     Has bronchitis this time of year. 9. PE RISK FACTORS: "Do you have a history of blood clots?" (or: recent major surgery, recent prolonged travel, bedridden)      10. OTHER SYMPTOMS: "Do you have any other symptoms?" (e.g., runny nose, wheezing, chest pain)       Nose is running but clear 11. PREGNANCY: "Is there any chance you are pregnant?" "When was your last menstrual period?"       na 12. 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

## 2022-04-14 NOTE — Telephone Encounter (Signed)
Message from Luciana Axe sent at 04/14/2022  7:49 AM EDT  Summary: Acute Bronchitis Advice   Pt was seen in office on 03/27/22 for Acute bronchitis, unspecified organism. Pt was prescribed predniSONE (DELTASONE) 10 MG tablet [[id number]]. Pt reports that he is continuing to cough. Please advise         Called pt and LM on VM to call back to discuss sx.

## 2022-04-15 ENCOUNTER — Ambulatory Visit (INDEPENDENT_AMBULATORY_CARE_PROVIDER_SITE_OTHER): Payer: Self-pay | Admitting: Nurse Practitioner

## 2022-04-15 ENCOUNTER — Encounter: Payer: Self-pay | Admitting: Nurse Practitioner

## 2022-04-15 VITALS — BP 138/88 | HR 80 | Temp 98.6°F | Wt 193.0 lb

## 2022-04-15 DIAGNOSIS — R062 Wheezing: Secondary | ICD-10-CM

## 2022-04-15 MED ORDER — TRIAMCINOLONE ACETONIDE 40 MG/ML IJ SUSP
40.0000 mg | Freq: Once | INTRAMUSCULAR | Status: AC
  Administered 2022-04-15: 40 mg via INTRAMUSCULAR

## 2022-04-15 NOTE — Progress Notes (Signed)
BP 138/88   Pulse 80   Temp 98.6 F (37 C) (Oral)   Wt 193 lb (87.5 kg)   SpO2 98%   BMI 29.25 kg/m    Subjective:    Patient ID: Luke Griffin, male    DOB: 1961-06-13, 61 y.o.   MRN: [id number]  HPI: Luke Griffin is a 61 y.o. male  Chief Complaint  Patient presents with   Cough    X 3 WEEKS PATIENT STATES HE WAS IN CONTACT WITH  A LOT OF DOGS OVER THE WEEKEND   UPPER RESPIRATORY TRACT INFECTION Patient states his symptoms improved but then he was around a bunch of dogs and that made his breathing worse.  He has been coughing since.  Worst symptom: 3 weeks Fever: no Cough: yes Shortness of breath: yes Wheezing: yes Chest pain: yes, with cough Chest tightness: no Chest congestion: yes Nasal congestion: no Runny nose: no Post nasal drip: yes Sneezing: no Sore throat: no Swollen glands: no Sinus pressure: no Headache: no Face pain: no Toothache: no Ear pain: no bilateral Ear pressure: no bilateral Eyes red/itching:no Eye drainage/crusting: no  Vomiting: no Rash: no Fatigue: yes Sick contacts: no Strep contacts: no  Context: worse Recurrent sinusitis: no Relief with OTC cold/cough medications: no  Treatments attempted: none  Has cut down to 1/2 ppd.   Relevant past medical, surgical, family and social history reviewed and updated as indicated. Interim medical history since our last visit reviewed. Allergies and medications reviewed and updated.  Review of Systems  Constitutional:  Positive for fatigue. Negative for fever.  HENT:  Positive for congestion and postnasal drip. Negative for ear pain, rhinorrhea, sinus pressure, sinus pain, sneezing and sore throat.   Respiratory:  Positive for cough, shortness of breath and wheezing. Negative for chest tightness.   Gastrointestinal:  Negative for vomiting.  Skin:  Negative for rash.  Neurological:  Negative for headaches.    Per HPI unless specifically indicated above     Objective:    BP  138/88   Pulse 80   Temp 98.6 F (37 C) (Oral)   Wt 193 lb (87.5 kg)   SpO2 98%   BMI 29.25 kg/m   Wt Readings from Last 3 Encounters:  04/15/22 193 lb (87.5 kg)  03/27/22 190 lb 4.8 oz (86.3 kg)  02/25/22 189 lb 6.4 oz (85.9 kg)    Physical Exam Vitals and nursing note reviewed.  Constitutional:      General: He is not in acute distress.    Appearance: Normal appearance. He is not ill-appearing, toxic-appearing or diaphoretic.  HENT:     Head: Normocephalic.     Right Ear: External ear normal.     Left Ear: External ear normal.     Nose: Nose normal. No congestion or rhinorrhea.     Mouth/Throat:     Mouth: Mucous membranes are moist.  Eyes:     General:        Right eye: No discharge.        Left eye: No discharge.     Extraocular Movements: Extraocular movements intact.     Conjunctiva/sclera: Conjunctivae normal.     Pupils: Pupils are equal, round, and reactive to light.  Cardiovascular:     Rate and Rhythm: Normal rate and regular rhythm.     Heart sounds: No murmur heard. Pulmonary:     Effort: Pulmonary effort is normal. No respiratory distress.     Breath sounds: Examination of the right-upper field reveals  wheezing. Examination of the right-lower field reveals wheezing. Wheezing present. No decreased breath sounds, rhonchi or rales.  Abdominal:     General: Abdomen is flat. Bowel sounds are normal.  Musculoskeletal:     Cervical back: Normal range of motion and neck supple.  Skin:    General: Skin is warm and dry.     Capillary Refill: Capillary refill takes less than 2 seconds.  Neurological:     General: No focal deficit present.     Mental Status: He is alert and oriented to person, place, and time.  Psychiatric:        Mood and Affect: Mood normal.        Behavior: Behavior normal.        Thought Content: Thought content normal.        Judgment: Judgment normal.     Results for orders placed or performed in visit on 11/19/21  Comprehensive  metabolic panel  Result Value Ref Range   Glucose 109 (H) 70 - 99 mg/dL   BUN 12 6 - 24 mg/dL   Creatinine, Ser 0.94 0.76 - 1.27 mg/dL   eGFR 94 >59 mL/min/1.73   BUN/Creatinine Ratio 13 9 - 20   Sodium 140 134 - 144 mmol/L   Potassium 3.6 3.5 - 5.2 mmol/L   Chloride 101 96 - 106 mmol/L   CO2 22 20 - 29 mmol/L   Calcium 9.4 8.7 - 10.2 mg/dL   Total Protein 6.9 6.0 - 8.5 g/dL   Albumin 4.8 3.8 - 4.9 g/dL   Globulin, Total 2.1 1.5 - 4.5 g/dL   Albumin/Globulin Ratio 2.3 (H) 1.2 - 2.2   Bilirubin Total 0.4 0.0 - 1.2 mg/dL   Alkaline Phosphatase 80 44 - 121 IU/L   AST 24 0 - 40 IU/L   ALT 34 0 - 44 IU/L  Urinalysis, Routine w reflex microscopic  Result Value Ref Range   Specific Gravity, UA >1.030 (H) 1.005 - 1.030   pH, UA 5.5 5.0 - 7.5   Color, UA Yellow Yellow   Appearance Ur Clear Clear   Leukocytes,UA Negative Negative   Protein,UA Negative Negative/Trace   Glucose, UA Negative Negative   Ketones, UA Negative Negative   RBC, UA Negative Negative   Bilirubin, UA Negative Negative   Urobilinogen, Ur 0.2 0.2 - 1.0 mg/dL   Nitrite, UA Negative Negative  Lipid panel  Result Value Ref Range   Cholesterol, Total 205 (H) 100 - 199 mg/dL   Triglycerides 132 0 - 149 mg/dL   HDL 43 >39 mg/dL   VLDL Cholesterol Cal 24 5 - 40 mg/dL   LDL Chol Calc (NIH) 138 (H) 0 - 99 mg/dL   Chol/HDL Ratio 4.8 0.0 - 5.0 ratio      Assessment & Plan:   Problem List Items Addressed This Visit   None Visit Diagnoses     Wheezing    -  Primary   Will treat with triamcinalone. Patient has albuterol at home.  Encouraged patient to use albuterol every 6-8 hours PRN for tightness and wheezing.   Relevant Medications   triamcinolone acetonide (KENALOG-40) injection 40 mg (Start on 04/15/2022  4:00 PM)        Follow up plan: No follow-ups on file.

## 2022-05-12 ENCOUNTER — Ambulatory Visit: Payer: Self-pay | Admitting: Nurse Practitioner

## 2022-05-12 DIAGNOSIS — R7303 Prediabetes: Secondary | ICD-10-CM | POA: Insufficient documentation

## 2022-05-12 NOTE — Progress Notes (Deleted)
There were no vitals taken for this visit.   Subjective:    Patient ID: Luke Griffin, male    DOB: [birth year], 61 y.o.   MRN: [id number]  HPI: Luke Griffin is a 61 y.o. male  No chief complaint on file.  HYPERTENSION / HYPERLIPIDEMIA Satisfied with current treatment? {Blank single:19197::"yes","no"} Duration of hypertension: {Blank single:19197::"chronic","months","years"} BP monitoring frequency: {Blank single:19197::"not checking","rarely","daily","weekly","monthly","a few times a day","a few times a week","a few times a month"} BP range:  BP medication side effects: {Blank single:19197::"yes","no"} Past BP meds: {Blank BEEFEOFH:21975::"OITG","PQDIYMEBRA","XENMMHWKGS/UPJSRPRXYV","OPFYTWKM","QKMMNOTRRN","HAFBXUXYBF/XOVA","NVBTYOMAYO (bystolic)","carvedilol","chlorthalidone","clonidine","diltiazem","exforge HCT","HCTZ","irbesartan (avapro)","labetalol","lisinopril","lisinopril-HCTZ","losartan (cozaar)","methyldopa","nifedipine","olmesartan (benicar)","olmesartan-HCTZ","quinapril","ramipril","spironalactone","tekturna","valsartan","valsartan-HCTZ","verapamil"} Duration of hyperlipidemia: {Blank single:19197::"chronic","months","years"} Cholesterol medication side effects: {Blank single:19197::"yes","no"} Cholesterol supplements: {Blank multiple:19196::"none","fish oil","niacin","red yeast rice"} Past cholesterol medications: {Blank multiple:19196::"none","atorvastain (lipitor)","lovastatin (mevacor)","pravastatin (pravachol)","rosuvastatin (crestor)","simvastatin (zocor)","vytorin","fenofibrate (tricor)","gemfibrozil","ezetimide (zetia)","niaspan","lovaza"} Medication compliance: {Blank single:19197::"excellent compliance","good compliance","fair compliance","poor compliance"} Aspirin: {Blank single:19197::"yes","no"} Recent stressors: {Blank single:19197::"yes","no"} Recurrent headaches: {Blank single:19197::"yes","no"} Visual changes: {Blank  single:19197::"yes","no"} Palpitations: {Blank single:19197::"yes","no"} Dyspnea: {Blank single:19197::"yes","no"} Chest pain: {Blank single:19197::"yes","no"} Lower extremity edema: {Blank single:19197::"yes","no"} Dizzy/lightheaded: {Blank single:19197::"yes","no"}   Relevant past medical, surgical, family and social history reviewed and updated as indicated. Interim medical history since our last visit reviewed. Allergies and medications reviewed and updated.  Review of Systems  Per HPI unless specifically indicated above     Objective:    There were no vitals taken for this visit.  Wt Readings from Last 3 Encounters:  04/15/22 193 lb (87.5 kg)  03/27/22 190 lb 4.8 oz (86.3 kg)  02/25/22 189 lb 6.4 oz (85.9 kg)    Physical Exam  Results for orders placed or performed in visit on 11/19/21  Comprehensive metabolic panel  Result Value Ref Range   Glucose 109 (H) 70 - 99 mg/dL   BUN 12 6 - 24 mg/dL   Creatinine, Ser 0.94 0.76 - 1.27 mg/dL   eGFR 94 >59 mL/min/1.73   BUN/Creatinine Ratio 13 9 - 20   Sodium 140 134 - 144 mmol/L   Potassium 3.6 3.5 - 5.2 mmol/L   Chloride 101 96 - 106 mmol/L   CO2 22 20 - 29 mmol/L   Calcium 9.4 8.7 - 10.2 mg/dL   Total Protein 6.9 6.0 - 8.5 g/dL   Albumin 4.8 3.8 - 4.9 g/dL   Globulin, Total 2.1 1.5 - 4.5 g/dL   Albumin/Globulin Ratio 2.3 (H) 1.2 - 2.2   Bilirubin Total 0.4 0.0 - 1.2 mg/dL   Alkaline Phosphatase 80 44 - 121 IU/L   AST 24 0 - 40 IU/L   ALT 34 0 - 44 IU/L  Urinalysis, Routine w reflex microscopic  Result Value Ref Range   Specific Gravity, UA >1.030 (H) 1.005 - 1.030   pH, UA 5.5 5.0 - 7.5   Color, UA Yellow Yellow   Appearance Ur Clear Clear   Leukocytes,UA Negative Negative   Protein,UA Negative Negative/Trace   Glucose, UA Negative Negative   Ketones, UA Negative Negative   RBC, UA Negative Negative   Bilirubin, UA Negative Negative   Urobilinogen, Ur 0.2 0.2 - 1.0 mg/dL   Nitrite, UA Negative Negative   Lipid panel  Result Value Ref Range   Cholesterol, Total 205 (H) 100 - 199 mg/dL   Triglycerides 132 0 - 149 mg/dL   HDL 43 >39 mg/dL   VLDL Cholesterol Cal 24 5 - 40 mg/dL   LDL Chol Calc (NIH) 138 (H) 0 - 99 mg/dL   Chol/HDL Ratio 4.8 0.0 - 5.0 ratio      Assessment & Plan:   Problem List Items Addressed This Visit  Cardiovascular and Mediastinum   Primary hypertension - Primary     Other   Hyperlipidemia     Follow up plan: No follow-ups on file.

## 2022-05-28 ENCOUNTER — Encounter: Payer: Self-pay | Admitting: Nurse Practitioner

## 2022-05-28 ENCOUNTER — Ambulatory Visit (INDEPENDENT_AMBULATORY_CARE_PROVIDER_SITE_OTHER): Payer: Self-pay | Admitting: Nurse Practitioner

## 2022-05-28 VITALS — BP 125/75 | HR 88 | Temp 99.0°F | Wt 189.0 lb

## 2022-05-28 DIAGNOSIS — R7303 Prediabetes: Secondary | ICD-10-CM

## 2022-05-28 DIAGNOSIS — E782 Mixed hyperlipidemia: Secondary | ICD-10-CM

## 2022-05-28 DIAGNOSIS — I10 Essential (primary) hypertension: Secondary | ICD-10-CM

## 2022-05-28 DIAGNOSIS — R051 Acute cough: Secondary | ICD-10-CM

## 2022-05-28 MED ORDER — HYDRALAZINE HCL 10 MG PO TABS: 10.0000 mg | ORAL_TABLET | Freq: Two times a day (BID) | ORAL | 1 refills | Status: DC

## 2022-05-28 MED ORDER — HYDROCHLOROTHIAZIDE 25 MG PO TABS: 25.0000 mg | ORAL_TABLET | Freq: Every day | ORAL | 1 refills | Status: DC

## 2022-05-28 MED ORDER — ATORVASTATIN CALCIUM 40 MG PO TABS: 40.0000 mg | ORAL_TABLET | Freq: Every day | ORAL | 1 refills | Status: DC

## 2022-05-28 MED ORDER — CEFTRIAXONE SODIUM 500 MG IJ SOLR
500.0000 mg | Freq: Once | INTRAMUSCULAR | Status: AC
  Administered 2022-05-28: 500 mg via INTRAMUSCULAR

## 2022-05-28 MED ORDER — LISINOPRIL 40 MG PO TABS: 40.0000 mg | ORAL_TABLET | Freq: Every day | ORAL | 1 refills | Status: DC

## 2022-05-28 MED ORDER — UMECLIDINIUM-VILANTEROL 62.5-25 MCG/ACT IN AEPB: 1.0000 | INHALATION_SPRAY | Freq: Every day | RESPIRATORY_TRACT | 4 refills | Status: DC

## 2022-05-28 NOTE — Progress Notes (Unsigned)
BP 125/75 (BP Location: Left Arm, Cuff Size: Normal)   Pulse 88   Temp 99 F (37.2 C) (Oral)   Wt 189 lb (85.7 kg)   SpO2 98%   BMI 28.64 kg/m    Subjective:    Patient ID: Luke Griffin, male    DOB: [birth year], 61 y.o.   MRN: [id number]  HPI: Luke Griffin is a 61 y.o. male  Chief Complaint  Patient presents with   Hypertension   Hyperlipidemia   Cough    Pt states he has started coughing again for the last 3 nights. States he doesn't cough much during the daily, mainly at night.    HYPERTENSION / HYPERLIPIDEMIA Satisfied with current treatment? yes Duration of hypertension: years BP monitoring frequency: daily BP range: 140/85 BP medication side effects: no Past BP meds: HCTZ and lisinopril Duration of hyperlipidemia: years Cholesterol medication side effects: no Cholesterol supplements: none Past cholesterol medications: atorvastain (lipitor) Medication compliance: excellent compliance Aspirin: no Recent stressors: no Recurrent headaches: no Visual changes: no Palpitations: no Dyspnea: no Chest pain: no Lower extremity edema: no Dizzy/lightheaded: no  UPPER RESPIRATORY TRACT INFECTION Worst symptom: coughing the last 3 nights Fever: no Cough: yes Shortness of breath: yes Wheezing: yes Chest pain: no Chest tightness: no Chest congestion: yes Nasal congestion: yes Runny nose: no Post nasal drip: no Sneezing: no Sore throat: no Swollen glands: no Sinus pressure: no Headache: no Face pain: no Toothache: no Ear pain: no bilateral Ear pressure: no bilateral Eyes red/itching:no Eye drainage/crusting: no  Vomiting: no Rash: no Fatigue: yes Sick contacts: no Strep contacts: no  Context: stable Recurrent sinusitis: no Relief with OTC cold/cough medications: no  Treatments attempted: none      Relevant past medical, surgical, family and social history reviewed and updated as indicated. Interim medical history since our last visit  reviewed. Allergies and medications reviewed and updated.  Review of Systems  Eyes:  Negative for visual disturbance.  Respiratory:  Negative for shortness of breath.   Cardiovascular:  Negative for chest pain and leg swelling.  Neurological:  Negative for light-headedness and headaches.    Per HPI unless specifically indicated above     Objective:    BP 125/75 (BP Location: Left Arm, Cuff Size: Normal)   Pulse 88   Temp 99 F (37.2 C) (Oral)   Wt 189 lb (85.7 kg)   SpO2 98%   BMI 28.64 kg/m   Wt Readings from Last 3 Encounters:  05/28/22 189 lb (85.7 kg)  04/15/22 193 lb (87.5 kg)  03/27/22 190 lb 4.8 oz (86.3 kg)    Physical Exam Vitals and nursing note reviewed.  Constitutional:      General: He is not in acute distress.    Appearance: Normal appearance. He is not ill-appearing, toxic-appearing or diaphoretic.  HENT:     Head: Normocephalic.     Right Ear: External ear normal.     Left Ear: External ear normal.     Nose: Nose normal. No congestion or rhinorrhea.     Mouth/Throat:     Mouth: Mucous membranes are moist.  Eyes:     General:        Right eye: No discharge.        Left eye: No discharge.     Extraocular Movements: Extraocular movements intact.     Conjunctiva/sclera: Conjunctivae normal.     Pupils: Pupils are equal, round, and reactive to light.  Cardiovascular:     Rate and Rhythm: Normal  rate and regular rhythm.     Heart sounds: No murmur heard. Pulmonary:     Effort: Pulmonary effort is normal. No respiratory distress.     Breath sounds: Wheezing present. No rhonchi or rales.  Abdominal:     General: Abdomen is flat. Bowel sounds are normal.  Musculoskeletal:     Cervical back: Normal range of motion and neck supple.  Skin:    General: Skin is warm and dry.     Capillary Refill: Capillary refill takes less than 2 seconds.  Neurological:     General: No focal deficit present.     Mental Status: He is alert and oriented to person, place,  and time.  Psychiatric:        Mood and Affect: Mood normal.        Behavior: Behavior normal.        Thought Content: Thought content normal.        Judgment: Judgment normal.     Results for orders placed or performed in visit on 05/28/22  Comp Met (CMET)  Result Value Ref Range   Glucose 119 (H) 70 - 99 mg/dL   BUN 9 6 - 24 mg/dL   Creatinine, Ser 1.02 0.76 - 1.27 mg/dL   eGFR 85 >59 mL/min/1.73   BUN/Creatinine Ratio 9 9 - 20   Sodium 138 134 - 144 mmol/L   Potassium 3.5 3.5 - 5.2 mmol/L   Chloride 100 96 - 106 mmol/L   CO2 23 20 - 29 mmol/L   Calcium 9.3 8.7 - 10.2 mg/dL   Total Protein 6.8 6.0 - 8.5 g/dL   Albumin 4.7 3.8 - 4.9 g/dL   Globulin, Total 2.1 1.5 - 4.5 g/dL   Albumin/Globulin Ratio 2.2 1.2 - 2.2   Bilirubin Total 0.5 0.0 - 1.2 mg/dL   Alkaline Phosphatase 78 44 - 121 IU/L   AST 37 0 - 40 IU/L   ALT 41 0 - 44 IU/L  Lipid Profile  Result Value Ref Range   Cholesterol, Total 214 (H) 100 - 199 mg/dL   Triglycerides 153 (H) 0 - 149 mg/dL   HDL 45 >39 mg/dL   VLDL Cholesterol Cal 28 5 - 40 mg/dL   LDL Chol Calc (NIH) 141 (H) 0 - 99 mg/dL   Chol/HDL Ratio 4.8 0.0 - 5.0 ratio  HgB A1c  Result Value Ref Range   Hgb A1c MFr Bld 7.0 (H) 4.8 - 5.6 %   Est. average glucose Bld gHb Est-mCnc 154 mg/dL      Assessment & Plan:   Problem List Items Addressed This Visit       Cardiovascular and Mediastinum   Primary hypertension - Primary    Chronic.  Controlled.  Continue with current medication regimen of Lisinopril and HCTZ.  Refills sent today.  Labs ordered today.  Return to clinic in 6 months for reevaluation.  Call sooner if concerns arise.        Relevant Medications   atorvastatin (LIPITOR) 40 MG tablet   hydrALAZINE (APRESOLINE) 10 MG tablet   hydrochlorothiazide (HYDRODIURIL) 25 MG tablet   lisinopril (ZESTRIL) 40 MG tablet   Other Relevant Orders   Comp Met (CMET) (Completed)     Other   Hyperlipidemia    Chronic.  Controlled.  Continue with  current medication regimen of Atorvastatin daily.  Refills sent today.  Labs ordered today.  Return to clinic in 6 months for reevaluation.  Call sooner if concerns arise.  Relevant Medications   atorvastatin (LIPITOR) 40 MG tablet   hydrALAZINE (APRESOLINE) 10 MG tablet   hydrochlorothiazide (HYDRODIURIL) 25 MG tablet   lisinopril (ZESTRIL) 40 MG tablet   Other Relevant Orders   Lipid Profile (Completed)   Prediabetes    Labs ordered at visit today.  Will make recommendations based on lab results.         Relevant Orders   HgB A1c (Completed)   Other Visit Diagnoses     Acute cough       Will treat with rochephin injection at patient's request.  Follow up if not improved.   Relevant Medications   cefTRIAXone (ROCEPHIN) injection 500 mg (Completed)        Follow up plan: Return in about 6 months (around 11/27/2022) for HTN, HLD, DM2 FU.

## 2022-05-29 LAB — COMPREHENSIVE METABOLIC PANEL
ALT: 41 IU/L (ref 0–44)
AST: 37 IU/L (ref 0–40)
Albumin/Globulin Ratio: 2.2 (ref 1.2–2.2)
Albumin: 4.7 g/dL (ref 3.8–4.9)
Alkaline Phosphatase: 78 IU/L (ref 44–121)
BUN/Creatinine Ratio: 9 (ref 9–20)
BUN: 9 mg/dL (ref 6–24)
Bilirubin Total: 0.5 mg/dL (ref 0.0–1.2)
CO2: 23 mmol/L (ref 20–29)
Calcium: 9.3 mg/dL (ref 8.7–10.2)
Chloride: 100 mmol/L (ref 96–106)
Creatinine, Ser: 1.02 mg/dL (ref 0.76–1.27)
Globulin, Total: 2.1 g/dL (ref 1.5–4.5)
Glucose: 119 mg/dL — ABNORMAL HIGH (ref 70–99)
Potassium: 3.5 mmol/L (ref 3.5–5.2)
Sodium: 138 mmol/L (ref 134–144)
Total Protein: 6.8 g/dL (ref 6.0–8.5)
eGFR: 85 mL/min/{1.73_m2} (ref >59)

## 2022-05-29 LAB — HEMOGLOBIN A1C
Est. average glucose Bld gHb Est-mCnc: 154 mg/dL
Hgb A1c MFr Bld: 7 % — ABNORMAL HIGH (ref 4.8–5.6)

## 2022-05-29 LAB — LIPID PANEL
Chol/HDL Ratio: 4.8 ratio (ref 0.0–5.0)
Cholesterol, Total: 214 mg/dL — ABNORMAL HIGH (ref 100–199)
HDL: 45 mg/dL (ref >39)
LDL Chol Calc (NIH): 141 mg/dL — ABNORMAL HIGH (ref 0–99)
Triglycerides: 153 mg/dL — ABNORMAL HIGH (ref 0–149)
VLDL Cholesterol Cal: 28 mg/dL (ref 5–40)

## 2022-05-29 NOTE — Assessment & Plan Note (Signed)
Labs ordered at visit today.  Will make recommendations based on lab results.   

## 2022-05-29 NOTE — Assessment & Plan Note (Signed)
Chronic.  Controlled.  Continue with current medication regimen of Lisinopril and HCTZ.  Refills sent today.  Labs ordered today.  Return to clinic in 6 months for reevaluation.  Call sooner if concerns arise.   

## 2022-05-29 NOTE — Assessment & Plan Note (Signed)
Chronic.  Controlled.  Continue with current medication regimen of Atorvastatin daily.  Refills sent today.  Labs ordered today.  Return to clinic in 6 months for reevaluation.  Call sooner if concerns arise.    

## 2022-05-29 NOTE — Progress Notes (Signed)
Please let patient know that his A1c does show that he is diabetic.  His A1c is controlled at 7.0.  At this point, He does not need additional medication.  However, I recommend he decrease his carbohydrate intake (which includes sodas, sweet tea, and sweets).  His cholesterol is elevated.  I recommend working on a low fat diet and continuing with the atorvastatin. No other concerns at this time.  Follow up as discussed.

## 2022-06-02 ENCOUNTER — Other Ambulatory Visit: Payer: Self-pay

## 2022-06-03 ENCOUNTER — Ambulatory Visit: Payer: Self-pay | Admitting: Nurse Practitioner

## 2022-06-03 MED ORDER — ALBUTEROL SULFATE HFA 108 (90 BASE) MCG/ACT IN AERS: INHALATION_SPRAY | RESPIRATORY_TRACT | 2 refills | Status: DC

## 2022-06-04 ENCOUNTER — Ambulatory Visit: Payer: Self-pay

## 2022-06-04 NOTE — Telephone Encounter (Signed)
  Chief Complaint: congestion Symptoms: cough with congestion and nasal drainage, SOB  Frequency: 2 weeks  Pertinent Negatives: Patient denies fever  Disposition: [] ED /[] Urgent Care (no appt availability in office) / [] Appointment(In office/virtual)/ []  [place] [facility]/ [] Home Care/ [] Refused Recommended Disposition /[] [place] [facility]/ [x]  Follow-up with PCP Additional Notes: pt states he has Rocephin injection 1 week ago and hasn't helped with symptoms. States he doesn't like to take medication but needs something prescribed to help. Pt hasn't been taking anything OTC. Advised I would send message back and see if provider will send abx in or will want to see pt again. Pt verbalized understanding.   Summary: productive cough, chest congestion, and nasal drainage   Pt states he received an injection for bronchitis on 10-13  Pt states he still has a productive cough, chest congestion, and nasal drainage  Pt requesting an antibiotic  Please assist further     Reason for Disposition  [1] Continuous (nonstop) coughing interferes with work or school AND [2] no improvement using cough treatment per Care Advice  Answer Assessment - Initial Assessment Questions 1. ONSET: "When did the cough begin?"      Over 2 weeks 2. SEVERITY: "How bad is the cough today?"      Moderate  3. SPUTUM: "Describe the color of your sputum" (none, dry cough; clear, white, yellow, green)     yellowish 5. DIFFICULTY BREATHING: "Are you having difficulty breathing?" If Yes, ask: "How bad is it?" (e.g., mild, moderate, severe)    - MILD: No SOB at rest, mild SOB with walking, speaks normally in sentences, can lie down, no retractions, pulse < 100.    - MODERATE: SOB at rest, SOB with minimal exertion and prefers to sit, cannot lie down flat, speaks in phrases, mild retractions, audible wheezing, pulse 100-120.    - SEVERE: Very SOB at rest, speaks in single words, struggling to breathe, sitting hunched  forward, retractions, pulse > 120      Mild  6. FEVER: "Do you have a fever?" If Yes, ask: "What is your temperature, how was it measured, and when did it start?"     no 10. OTHER SYMPTOMS: "Do you have any other symptoms?" (e.g., runny nose, wheezing, chest pain)       Cough and congestion and nasal drainage  Protocols used: Cough - Acute Productive-A-AH

## 2022-06-05 ENCOUNTER — Encounter: Payer: Self-pay | Admitting: *Deleted

## 2022-06-05 NOTE — Telephone Encounter (Signed)
Patient has already been treated with antibiotics twice without resolution.  He will need a chest xray to rule out pneumonia before antibiotics can be prescribed.

## 2022-06-05 NOTE — Telephone Encounter (Signed)
This encounter was created in error - please disregard.

## 2022-06-05 NOTE — Telephone Encounter (Signed)
Called and LVM asking for patient to please return my call.   OK for PEC to speak with patient and advise him of Karen's message.

## 2022-06-05 NOTE — Telephone Encounter (Signed)
Patient returned call. Message from K. Caren Griffins, NP from 06/05/22 reviewed with patient chest xray needed to rule out pneumonia before additional antibiotics ordered. Patient verbalized understanding and reports if he feels worse tonight he will go to UC and if not he will call back in am and see if order for chest x ray in and where to f/u.

## 2022-07-19 ENCOUNTER — Emergency Department
Admission: EM | Admit: 2022-07-19 | Discharge: 2022-07-20 | Disposition: A | Discharge status: Home / Self Care | Payer: Self-pay | Attending: Emergency Medicine | Admitting: Emergency Medicine

## 2022-07-19 ENCOUNTER — Other Ambulatory Visit: Payer: Self-pay

## 2022-07-19 DIAGNOSIS — R7989 Other specified abnormal findings of blood chemistry: Secondary | ICD-10-CM | POA: Insufficient documentation

## 2022-07-19 DIAGNOSIS — M79602 Pain in left arm: Secondary | ICD-10-CM | POA: Insufficient documentation

## 2022-07-19 DIAGNOSIS — I10 Essential (primary) hypertension: Secondary | ICD-10-CM | POA: Insufficient documentation

## 2022-07-19 DIAGNOSIS — R0789 Other chest pain: Secondary | ICD-10-CM | POA: Insufficient documentation

## 2022-07-19 NOTE — ED Provider Notes (Signed)
[facility] Provider Note    Event Date/Time   First MD Initiated Contact with Patient 07/19/22 2332     (approximate)   History   Extremity Pain   HPI {Remember to add pertinent medical, surgical, social, and/or OB history to HPI:1} Luke Griffin is a 62 y.o. male  ***       Physical Exam   Triage Vital Signs: ED Triage Vitals  Enc Vitals Group     BP 07/19/22 2339 (!) 170/99     Pulse Rate 07/19/22 2339 64     Resp 07/19/22 2339 18     Temp 07/19/22 2339 (!) 97.4 F (36.3 C)     Temp Source 07/19/22 2339 Oral     SpO2 07/19/22 2339 99 %     Weight 07/19/22 2340 83.9 kg (185 lb)     Height 07/19/22 2340 1.702 m (5\' 7" )     Head Circumference --      Peak Flow --      Pain Score 07/19/22 2340 6     Pain Loc --      Pain Edu? --      Excl. in [place]? --     Most recent vital signs: Vitals:   07/19/22 2339  BP: (!) 170/99  Pulse: 64  Resp: 18  Temp: (!) 97.4 F (36.3 C)  SpO2: 99%    {Only need to document appropriate and relevant physical exam:1} General: Awake, no distress. *** CV:  Good peripheral perfusion. *** Resp:  Normal effort. Speaking easily and comfortably, no accessory muscle usage nor intercostal retractions.  *** Abd:  No distention. *** Other:  ***   ED Results / Procedures / Treatments   Labs (all labs ordered are listed, but only abnormal results are displayed) Labs Reviewed  CBC WITH DIFFERENTIAL/PLATELET  COMPREHENSIVE METABOLIC PANEL  LIPASE, BLOOD  TROPONIN I (HIGH SENSITIVITY)     EKG  ED ECG REPORT I, Hinda Kehr, the attending physician, personally viewed and interpreted this ECG.  Date: 07/19/2022 EKG Time: 23: 40 Rate: 62 Rhythm: normal sinus rhythm QRS Axis: Borderline left axis deviation Intervals: normal ST/T Wave abnormalities: normal Narrative Interpretation: no evidence of acute ischemia    RADIOLOGY *** {USE THE WORD "INTERPRETED"!! You MUST document your own  interpretation of imaging, as well as the fact that you reviewed the radiologist's report!:1}   PROCEDURES:  Critical Care performed: {CriticalCareYesNo:19197::"Yes, see critical care procedure note(s)","No"}  Procedures   MEDICATIONS ORDERED IN ED: Medications - No data to display   IMPRESSION / MDM / [place] / ED COURSE  I reviewed the triage vital signs and the nursing notes.                              Differential diagnosis includes, but is not limited to, ***  Patient's presentation is most consistent with {EM COPA:27473}  ***  {**The patient is on the cardiac monitor to evaluate for evidence of arrhythmia and/or significant heart rate changes.**}   Labs/studies ordered:  *** Interventions/Medications given:  *** [facility] Course my include additional interventions not listed in this section:)       FINAL CLINICAL IMPRESSION(S) / ED DIAGNOSES   Final diagnoses:  None     Rx / DC Orders   ED Discharge Orders     None        Note:  This document was prepared using Dragon voice recognition  software and may include unintentional dictation errors.
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
## 2022-07-20 ENCOUNTER — Emergency Department: Payer: Self-pay

## 2022-07-20 LAB — CBC WITH DIFFERENTIAL/PLATELET
Abs Immature Granulocytes: 0.03 10*3/uL (ref 0.00–0.07)
Basophils Absolute: 0.1 10*3/uL (ref 0.0–0.1)
Basophils Relative: 1 %
Eosinophils Absolute: 0.8 10*3/uL — ABNORMAL HIGH (ref 0.0–0.5)
Eosinophils Relative: 9 %
HCT: 46.7 % (ref 39.0–52.0)
Hemoglobin: 15.4 g/dL (ref 13.0–17.0)
Immature Granulocytes: 0 %
Lymphocytes Relative: 28 %
Lymphs Abs: 2.4 10*3/uL (ref 0.7–4.0)
MCH: 31.3 pg (ref 26.0–34.0)
MCHC: 33 g/dL (ref 30.0–36.0)
MCV: 94.9 fL (ref 80.0–100.0)
Monocytes Absolute: 0.7 10*3/uL (ref 0.1–1.0)
Monocytes Relative: 8 %
Neutro Abs: 4.5 10*3/uL (ref 1.7–7.7)
Neutrophils Relative %: 54 %
Platelets: 226 10*3/uL (ref 150–400)
RBC: 4.92 MIL/uL (ref 4.22–5.81)
RDW: 12.4 % (ref 11.5–15.5)
WBC: 8.4 10*3/uL (ref 4.0–10.5)
nRBC: 0 % (ref 0.0–0.2)

## 2022-07-20 LAB — COMPREHENSIVE METABOLIC PANEL
ALT: 34 U/L (ref 0–44)
AST: 29 U/L (ref 15–41)
Albumin: 4.2 g/dL (ref 3.5–5.0)
Alkaline Phosphatase: 70 U/L (ref 38–126)
Anion gap: 7 (ref 5–15)
BUN: 12 mg/dL (ref 6–20)
CO2: 31 mmol/L (ref 22–32)
Calcium: 9 mg/dL (ref 8.9–10.3)
Chloride: 105 mmol/L (ref 98–111)
Creatinine, Ser: 0.94 mg/dL (ref 0.61–1.24)
GFR, Estimated: >60 mL/min (ref >60)
Glucose, Bld: 164 mg/dL — ABNORMAL HIGH (ref 70–99)
Potassium: 3.4 mmol/L — ABNORMAL LOW (ref 3.5–5.1)
Sodium: 143 mmol/L (ref 135–145)
Total Bilirubin: 0.6 mg/dL (ref 0.3–1.2)
Total Protein: 7.3 g/dL (ref 6.5–8.1)

## 2022-07-20 LAB — TROPONIN I (HIGH SENSITIVITY)
Troponin I (High Sensitivity): 21 ng/L — ABNORMAL HIGH (ref <18)
Troponin I (High Sensitivity): 24 ng/L — ABNORMAL HIGH (ref <18)

## 2022-07-20 LAB — LIPASE, BLOOD: Lipase: 50 U/L (ref 11–51)

## 2022-07-20 MED ORDER — ASPIRIN 81 MG PO CHEW
324.0000 mg | CHEWABLE_TABLET | Freq: Once | ORAL | Status: AC
  Administered 2022-07-20: 324 mg via ORAL
  Filled 2022-07-20: qty 4

## 2022-07-20 NOTE — Discharge Instructions (Addendum)

## 2022-09-01 ENCOUNTER — Other Ambulatory Visit: Payer: Self-pay

## 2022-09-01 DIAGNOSIS — I10 Essential (primary) hypertension: Secondary | ICD-10-CM

## 2022-09-01 DIAGNOSIS — N529 Male erectile dysfunction, unspecified: Secondary | ICD-10-CM

## 2022-09-01 DIAGNOSIS — Z13220 Encounter for screening for lipoid disorders: Secondary | ICD-10-CM

## 2022-09-01 MED ORDER — FLUTICASONE PROPIONATE 50 MCG/ACT NA SUSP: 2.0000 | Freq: Every day | NASAL | 3 refills | Status: DC

## 2022-09-15 ENCOUNTER — Other Ambulatory Visit: Payer: Self-pay

## 2022-09-15 MED ORDER — HYDROXYZINE PAMOATE 25 MG PO CAPS: 25.0000 mg | ORAL_CAPSULE | ORAL | 0 refills | Status: DC | PRN

## 2022-11-27 ENCOUNTER — Ambulatory Visit: Payer: Self-pay | Admitting: Nurse Practitioner

## 2022-12-01 ENCOUNTER — Encounter: Payer: Self-pay | Admitting: Nurse Practitioner

## 2022-12-01 ENCOUNTER — Ambulatory Visit (INDEPENDENT_AMBULATORY_CARE_PROVIDER_SITE_OTHER): Payer: Self-pay | Admitting: Nurse Practitioner

## 2022-12-01 VITALS — BP 170/93 | HR 86 | Temp 98.9°F | Wt 178.4 lb

## 2022-12-01 DIAGNOSIS — I10 Essential (primary) hypertension: Secondary | ICD-10-CM

## 2022-12-01 DIAGNOSIS — R7303 Prediabetes: Secondary | ICD-10-CM

## 2022-12-01 DIAGNOSIS — E782 Mixed hyperlipidemia: Secondary | ICD-10-CM

## 2022-12-01 MED ORDER — UMECLIDINIUM-VILANTEROL 62.5-25 MCG/ACT IN AEPB: 1.0000 | INHALATION_SPRAY | Freq: Every day | RESPIRATORY_TRACT | 4 refills | Status: DC

## 2022-12-01 MED ORDER — HYDROCHLOROTHIAZIDE 25 MG PO TABS: 25.0000 mg | ORAL_TABLET | Freq: Every day | ORAL | 1 refills | Status: DC

## 2022-12-01 MED ORDER — LISINOPRIL 40 MG PO TABS: 40.0000 mg | ORAL_TABLET | Freq: Every day | ORAL | 1 refills | Status: DC

## 2022-12-01 NOTE — Assessment & Plan Note (Signed)
Chronic.  Not well controlled.  Agrees to start the Hydralazine 10mg  BID.  Continue with current medication regimen of Lisinopril and HCTZ.  Refills sent today.  Labs ordered today.  Return to clinic in 1 months for reevaluation.  Call sooner if concerns arise.

## 2022-12-01 NOTE — Progress Notes (Signed)
BP (!) 170/93 (BP Location: Left Arm, Cuff Size: Normal)   Pulse 86   Temp 98.9 F (37.2 C) (Oral)   Wt 178 lb 6.4 oz (80.9 kg)   SpO2 98%   BMI 27.94 kg/m    Subjective:    Patient ID: Luke Griffin, male    DOB: [birth year], 62 y.o.   MRN: [id number]  HPI: Luke Griffin is a 62 y.o. male  Chief Complaint  Patient presents with   Hyperlipidemia   Hypertension   HYPERTENSION / HYPERLIPIDEMIA Satisfied with current treatment? yes Duration of hypertension: years BP monitoring frequency: daily BP range: 140/85-150/80 BP medication side effects: no Past BP meds: HCTZ and lisinopril Duration of hyperlipidemia: years Cholesterol medication side effects: no Cholesterol supplements: none Past cholesterol medications: not able to tolerate statins Medication compliance: excellent compliance Aspirin: no Recent stressors: no Recurrent headaches: no Visual changes: no Palpitations: no Dyspnea: no Chest pain: no Lower extremity edema: no Dizzy/lightheaded: no   Relevant past medical, surgical, family and social history reviewed and updated as indicated. Interim medical history since our last visit reviewed. Allergies and medications reviewed and updated.  Review of Systems  Eyes:  Negative for visual disturbance.  Respiratory:  Negative for shortness of breath.   Cardiovascular:  Negative for chest pain and leg swelling.  Neurological:  Negative for light-headedness and headaches.    Per HPI unless specifically indicated above     Objective:    BP (!) 170/93 (BP Location: Left Arm, Cuff Size: Normal)   Pulse 86   Temp 98.9 F (37.2 C) (Oral)   Wt 178 lb 6.4 oz (80.9 kg)   SpO2 98%   BMI 27.94 kg/m   Wt Readings from Last 3 Encounters:  12/01/22 178 lb 6.4 oz (80.9 kg)  07/19/22 185 lb (83.9 kg)  05/28/22 189 lb (85.7 kg)    Physical Exam Vitals and nursing note reviewed.  Constitutional:      General: He is not in acute distress.    Appearance:  Normal appearance. He is not ill-appearing, toxic-appearing or diaphoretic.  HENT:     Head: Normocephalic.     Right Ear: External ear normal.     Left Ear: External ear normal.     Nose: Nose normal. No congestion or rhinorrhea.     Mouth/Throat:     Mouth: Mucous membranes are moist.  Eyes:     General:        Right eye: No discharge.        Left eye: No discharge.     Extraocular Movements: Extraocular movements intact.     Conjunctiva/sclera: Conjunctivae normal.     Pupils: Pupils are equal, round, and reactive to light.  Cardiovascular:     Rate and Rhythm: Normal rate and regular rhythm.     Heart sounds: No murmur heard. Pulmonary:     Effort: Pulmonary effort is normal. No respiratory distress.     Breath sounds: Wheezing present. No rhonchi or rales.  Abdominal:     General: Abdomen is flat. Bowel sounds are normal.  Musculoskeletal:     Cervical back: Normal range of motion and neck supple.  Skin:    General: Skin is warm and dry.     Capillary Refill: Capillary refill takes less than 2 seconds.  Neurological:     General: No focal deficit present.     Mental Status: He is alert and oriented to person, place, and time.  Psychiatric:  Mood and Affect: Mood normal.        Behavior: Behavior normal.        Thought Content: Thought content normal.        Judgment: Judgment normal.     Results for orders placed or performed during the hospital encounter of 07/19/22  CBC with Differential  Result Value Ref Range   WBC 8.4 4.0 - 10.5 K/uL   RBC 4.92 4.22 - 5.81 MIL/uL   Hemoglobin 15.4 13.0 - 17.0 g/dL   HCT 40.9 81.1 - 91.4 %   MCV 94.9 80.0 - 100.0 fL   MCH 31.3 26.0 - 34.0 pg   MCHC 33.0 30.0 - 36.0 g/dL   RDW 78.2 95.6 - 21.3 %   Platelets 226 150 - 400 K/uL   nRBC 0.0 0.0 - 0.2 %   Neutrophils Relative % 54 %   Neutro Abs 4.5 1.7 - 7.7 K/uL   Lymphocytes Relative 28 %   Lymphs Abs 2.4 0.7 - 4.0 K/uL   Monocytes Relative 8 %   Monocytes Absolute  0.7 0.1 - 1.0 K/uL   Eosinophils Relative 9 %   Eosinophils Absolute 0.8 (H) 0.0 - 0.5 K/uL   Basophils Relative 1 %   Basophils Absolute 0.1 0.0 - 0.1 K/uL   Immature Granulocytes 0 %   Abs Immature Granulocytes 0.03 0.00 - 0.07 K/uL  Comprehensive metabolic panel  Result Value Ref Range   Sodium 143 135 - 145 mmol/L   Potassium 3.4 (L) 3.5 - 5.1 mmol/L   Chloride 105 98 - 111 mmol/L   CO2 31 22 - 32 mmol/L   Glucose, Bld 164 (H) 70 - 99 mg/dL   BUN 12 6 - 20 mg/dL   Creatinine, Ser 0.86 0.61 - 1.24 mg/dL   Calcium 9.0 8.9 - 57.8 mg/dL   Total Protein 7.3 6.5 - 8.1 g/dL   Albumin 4.2 3.5 - 5.0 g/dL   AST 29 15 - 41 U/L   ALT 34 0 - 44 U/L   Alkaline Phosphatase 70 38 - 126 U/L   Total Bilirubin 0.6 0.3 - 1.2 mg/dL   GFR, Estimated >46 >96 mL/min   Anion gap 7 5 - 15  Lipase, blood  Result Value Ref Range   Lipase 50 11 - 51 U/L  Troponin I (High Sensitivity)  Result Value Ref Range   Troponin I (High Sensitivity) 24 (H) <18 ng/L  Troponin I (High Sensitivity)  Result Value Ref Range   Troponin I (High Sensitivity) 21 (H) <18 ng/L      Assessment & Plan:   Problem List Items Addressed This Visit       Cardiovascular and Mediastinum   Primary hypertension - Primary    Chronic.  Not well controlled.  Agrees to start the Hydralazine 10mg  BID.  Continue with current medication regimen of Lisinopril and HCTZ.  Refills sent today.  Labs ordered today.  Return to clinic in 1 months for reevaluation.  Call sooner if concerns arise.        Relevant Medications   lisinopril (ZESTRIL) 40 MG tablet   hydrochlorothiazide (HYDRODIURIL) 25 MG tablet   Other Relevant Orders   Comp Met (CMET)     Other   Hyperlipidemia    Chronic.  Controlled.  Does not tolerate statins.  Refills sent today.  Labs ordered today.  Return to clinic in 6 months for reevaluation.  Call sooner if concerns arise.        Relevant Medications  lisinopril (ZESTRIL) 40 MG tablet    hydrochlorothiazide (HYDRODIURIL) 25 MG tablet   Other Relevant Orders   Lipid Profile   Prediabetes    Labs ordered at visit today.  Will make recommendations based on lab results. Last A1c was 7.0%.  Paitent is likely diabetic. Will recheck A1c today. Can discuss results at 1 month follow up.       Relevant Orders   HgB A1c     Follow up plan: Return in about 1 month (around 12/31/2022) for BP Check.

## 2022-12-01 NOTE — Patient Instructions (Signed)
Start taking Hydralazine

## 2022-12-01 NOTE — Assessment & Plan Note (Addendum)
Chronic.  Controlled.  Does not tolerate statins.  Refills sent today.  Labs ordered today.  Return to clinic in 6 months for reevaluation.  Call sooner if concerns arise.

## 2022-12-01 NOTE — Assessment & Plan Note (Addendum)
Labs ordered at visit today.  Will make recommendations based on lab results. Last A1c was 7.0%.  Paitent is likely diabetic. Will recheck A1c today. Can discuss results at 1 month follow up.

## 2022-12-02 LAB — LIPID PANEL
Chol/HDL Ratio: 5.6 ratio — ABNORMAL HIGH (ref 0.0–5.0)
Cholesterol, Total: 228 mg/dL — ABNORMAL HIGH (ref 100–199)
HDL: 41 mg/dL (ref >39)
LDL Chol Calc (NIH): 146 mg/dL — ABNORMAL HIGH (ref 0–99)
Triglycerides: 223 mg/dL — ABNORMAL HIGH (ref 0–149)
VLDL Cholesterol Cal: 41 mg/dL — ABNORMAL HIGH (ref 5–40)

## 2022-12-02 LAB — COMPREHENSIVE METABOLIC PANEL
ALT: 33 IU/L (ref 0–44)
AST: 26 IU/L (ref 0–40)
Albumin: 4.8 g/dL (ref 3.8–4.9)
Alkaline Phosphatase: 85 IU/L (ref 44–121)
BUN/Creatinine Ratio: 9 (ref 9–20)
BUN: 9 mg/dL (ref 6–24)
Bilirubin Total: 0.5 mg/dL (ref 0.0–1.2)
CO2: 27 mmol/L (ref 20–29)
Calcium: 9.8 mg/dL (ref 8.7–10.2)
Chloride: 100 mmol/L (ref 96–106)
Creatinine, Ser: 1.03 mg/dL (ref 0.76–1.27)
Globulin, Total: 2.6 g/dL (ref 1.5–4.5)
Glucose: 99 mg/dL (ref 70–99)
Potassium: 3.8 mmol/L (ref 3.5–5.2)
Sodium: 141 mmol/L (ref 134–144)
Total Protein: 7.4 g/dL (ref 6.0–8.5)
eGFR: 84 mL/min/{1.73_m2} (ref >59)

## 2022-12-02 LAB — HEMOGLOBIN A1C
Est. average glucose Bld gHb Est-mCnc: 146 mg/dL
Hgb A1c MFr Bld: 6.7 % — ABNORMAL HIGH (ref 4.8–5.6)

## 2022-12-02 MED ORDER — ROSUVASTATIN CALCIUM 5 MG PO TABS: 5.0000 mg | ORAL_TABLET | Freq: Every day | ORAL | 1 refills | Status: DC

## 2022-12-02 NOTE — Addendum Note (Signed)
Addended by: Larae Grooms on: 12/02/2022 04:06 PM   Modules accepted: Orders

## 2022-12-02 NOTE — Progress Notes (Signed)
Medication sent to the pharmacy.

## 2022-12-02 NOTE — Progress Notes (Signed)
Please let patient know that his lab work shows that he is diabetic.  When he returns in 1 month we will discuss more about that.  His cholesterol is elevated.  I recommend he try a different statin medication call crestor and it would be 5mg  once daily.  This will help lower his cholesterol as well as give protection from stroke and heart attack.  If he agrees, I will send this to the pharmacy.

## 2022-12-03 ENCOUNTER — Ambulatory Visit: Payer: Self-pay | Admitting: *Deleted

## 2022-12-03 NOTE — Telephone Encounter (Signed)
Summary: med ?   Pt called in wants to know what med, rosuvastatin (CRESTOR) 5 MG tablet is for.     Attempted to call patient- left message to call office.

## 2022-12-03 NOTE — Telephone Encounter (Signed)
Second attempt to reach pt, left VM to call back. 

## 2022-12-03 NOTE — Telephone Encounter (Signed)
Copied from CRM [id number]. Topic: General - Inquiry >> Dec 03, 2022  3:13 PM Marlow Baars wrote: Reason for CRM: The patient called in stating someone from the office tried to contact him. I tried to call the office but was placed on hold for quite some time. Please assist patient further

## 2022-12-03 NOTE — Telephone Encounter (Signed)
Called and left messge on machine for patient to inform him that is RX has been sent to the pharmacy

## 2022-12-20 ENCOUNTER — Other Ambulatory Visit: Payer: Self-pay | Admitting: Nurse Practitioner

## 2022-12-22 NOTE — Telephone Encounter (Signed)
Requested Prescriptions  Pending Prescriptions Disp Refills   albuterol (VENTOLIN HFA) 108 (90 Base) MCG/ACT inhaler [Pharmacy Med Name: Albuterol Sulfate HFA 108 (90 Base) MCG/ACT Inhalation Aerosol Solution] 8 g 2    Sig: INHALE 2 PUFFS BY MOUTH EVERY 6 HOURS AS NEEDED FOR WHEEZING FOR SHORTNESS OF BREATH     Pulmonology:  Beta Agonists 2 Failed - 12/20/2022 11:33 AM      Failed - Last BP in normal range    BP Readings from Last 1 Encounters:  12/01/22 (!) 170/93         Passed - Last Heart Rate in normal range    Pulse Readings from Last 1 Encounters:  12/01/22 86         Passed - Valid encounter within last 12 months    Recent Outpatient Visits           3 weeks ago Primary hypertension   [place] [facility] Larae Grooms, NP   6 months ago Primary hypertension   [place] [facility] Larae Grooms, NP   8 months ago Wheezing   [place] [facility] Larae Grooms, NP   9 months ago Acute bronchitis, unspecified organism   [place] [facility] Larae Grooms, NP   10 months ago Uncontrolled hypertension   [place] [facility] Gabriel Cirri, NP       Future Appointments             In 2 weeks Mecum, Oswaldo Conroy, PA-C [facility]

## 2023-01-08 ENCOUNTER — Ambulatory Visit (INDEPENDENT_AMBULATORY_CARE_PROVIDER_SITE_OTHER): Payer: Self-pay | Admitting: Physician Assistant

## 2023-01-08 ENCOUNTER — Encounter: Payer: Self-pay | Admitting: Physician Assistant

## 2023-01-08 VITALS — BP 182/103 | HR 66 | Temp 98.3°F | Wt 184.4 lb

## 2023-01-08 DIAGNOSIS — I10 Essential (primary) hypertension: Secondary | ICD-10-CM

## 2023-01-08 DIAGNOSIS — N529 Male erectile dysfunction, unspecified: Secondary | ICD-10-CM

## 2023-01-08 MED ORDER — TADALAFIL 5 MG PO TABS: 5.0000 mg | ORAL_TABLET | Freq: Every day | ORAL | 3 refills | Status: DC

## 2023-01-08 MED ORDER — AMLODIPINE BESYLATE 5 MG PO TABS: 5.0000 mg | ORAL_TABLET | Freq: Every day | ORAL | 1 refills | Status: DC

## 2023-01-08 NOTE — Assessment & Plan Note (Signed)
Chronic, historic condition He requests refills today  Will send script per request  Reviewed side effects and safety precautions  Follow up as needed for persistent or progressing symptoms

## 2023-01-08 NOTE — Progress Notes (Signed)
Acute Office Visit   Patient: Luke Griffin   DOB: 01/31/1961   62 y.o. Male  MRN: [id number] Visit Date: 01/08/2023  Today's healthcare provider: Oswaldo Conroy Kendalyn Cranfield, PA-C  Introduced myself to the patient as a Secondary school teacher and provided education on APPs in clinical practice.    Chief Complaint  Patient presents with   Hypertension   Subjective    HPI    Hypertension: - Medications: Lisinopril 40 mg PO every day, hydrochlorothiazide 25 mg PO every day,  - Compliance: Good  - Checking BP at home: he has been checking at home 140s/80s-90s  Reports when it feels very high (over 200/100s)  he will start getting a headache  He has tried to reduce eating red meat and smoking   Was planned to start Hydralazine at previous visit but did not start as discussed   Medications: Outpatient Medications Prior to Visit  Medication Sig   albuterol (VENTOLIN HFA) 108 (90 Base) MCG/ACT inhaler INHALE 2 PUFFS BY MOUTH EVERY 6 HOURS AS NEEDED FOR WHEEZING FOR SHORTNESS OF BREATH   fluticasone (FLONASE) 50 MCG/ACT nasal spray Place 2 sprays into both nostrils daily.   hydrochlorothiazide (HYDRODIURIL) 25 MG tablet Take 1 tablet (25 mg total) by mouth daily.   hydrOXYzine (VISTARIL) 25 MG capsule Take 1 capsule (25 mg total) by mouth as needed.   lisinopril (ZESTRIL) 40 MG tablet Take 1 tablet (40 mg total) by mouth daily.   rosuvastatin (CRESTOR) 5 MG tablet Take 1 tablet (5 mg total) by mouth daily.   [DISCONTINUED] tadalafil (CIALIS) 5 MG tablet Take 1 tablet (5 mg total) by mouth daily.   pantoprazole (PROTONIX) 40 MG tablet Take 1 tablet (40 mg total) by mouth daily.   [DISCONTINUED] hydrALAZINE (APRESOLINE) 10 MG tablet Take 1 tablet (10 mg total) by mouth 2 (two) times daily. (Patient not taking: Reported on 12/01/2022)   [DISCONTINUED] umeclidinium-vilanterol (ANORO ELLIPTA) 62.5-25 MCG/ACT AEPB Inhale 1 puff into the lungs daily at 6 (six) AM. (Patient not taking: Reported on 01/08/2023)    No facility-administered medications prior to visit.    Review of Systems  Eyes:  Negative for visual disturbance.  Respiratory:  Negative for shortness of breath and wheezing.   Cardiovascular:  Negative for chest pain, palpitations and leg swelling.  Neurological:  Positive for headaches. Negative for dizziness and light-headedness.         Objective    BP (!) 182/103   Pulse 66   Temp 98.3 F (36.8 C) (Oral)   Wt 184 lb 6.4 oz (83.6 kg)   SpO2 97%   BMI 28.88 kg/m      Physical Exam Vitals reviewed.  Constitutional:      General: He is awake.     Appearance: Normal appearance. He is well-developed and well-groomed.  HENT:     Head: Normocephalic and atraumatic.  Cardiovascular:     Rate and Rhythm: Normal rate and regular rhythm.     Pulses: Normal pulses.     Heart sounds: Normal heart sounds. No murmur heard.    No friction rub. No gallop.  Pulmonary:     Effort: Pulmonary effort is normal.     Breath sounds: Normal breath sounds. No decreased air movement. No decreased breath sounds, wheezing, rhonchi or rales.  Musculoskeletal:     Cervical back: Normal range of motion.     Right lower leg: No edema.     Left lower leg: No edema.  Neurological:     Mental Status: He is alert.  Psychiatric:        Behavior: Behavior is cooperative.       No results found for any visits on 01/08/23.  Assessment & Plan      Return in about 4 weeks (around 02/05/2023) for HTN.     Problem List Items Addressed This Visit       Cardiovascular and Mediastinum   Primary hypertension - Primary    Chronic, does not appear well controlled at this time  He reports home BP measures are typically in 140s/90s but does have symptomatic highs  He is taking hydrochlorothiazide 25 mg PO every day and Lisinopril 40 mg PO every day  Will add Amlodipine 5 mg PO every day to regimen for further control  Follow up in 4 weeks or sooner if concerns arise       Relevant  Medications   amLODipine (NORVASC) 5 MG tablet   tadalafil (CIALIS) 5 MG tablet     Other   Erectile dysfunction    Chronic, historic condition He requests refills today  Will send script per request  Reviewed side effects and safety precautions  Follow up as needed for persistent or progressing symptoms       Relevant Medications   tadalafil (CIALIS) 5 MG tablet     Return in about 4 weeks (around 02/05/2023) for HTN.   I, Yahsir Wickens E Jerelyn Trimarco, PA-C, have reviewed all documentation for this visit. The documentation on 01/08/23 for the exam, diagnosis, procedures, and orders are all accurate and complete.   Jacquelin Hawking, MHS, PA-C [facility]

## 2023-01-08 NOTE — Assessment & Plan Note (Signed)
Chronic, does not appear well controlled at this time  He reports home BP measures are typically in 140s/90s but does have symptomatic highs  He is taking hydrochlorothiazide 25 mg PO every day and Lisinopril 40 mg PO every day  Will add Amlodipine 5 mg PO every day to regimen for further control  Follow up in 4 weeks or sooner if concerns arise

## 2023-01-26 ENCOUNTER — Other Ambulatory Visit: Payer: Self-pay

## 2023-02-05 ENCOUNTER — Ambulatory Visit (INDEPENDENT_AMBULATORY_CARE_PROVIDER_SITE_OTHER): Payer: Self-pay | Admitting: Physician Assistant

## 2023-02-05 ENCOUNTER — Encounter: Payer: Self-pay | Admitting: Physician Assistant

## 2023-02-05 VITALS — BP 138/81 | HR 87 | Ht 67.0 in | Wt 183.8 lb

## 2023-02-05 DIAGNOSIS — I10 Essential (primary) hypertension: Secondary | ICD-10-CM

## 2023-02-05 MED ORDER — AMLODIPINE BESYLATE 2.5 MG PO TABS: 2.5000 mg | ORAL_TABLET | Freq: Every day | ORAL | 2 refills | Status: DC

## 2023-02-05 NOTE — Assessment & Plan Note (Addendum)
Chronic, historic condition Appears to be improving with addition of Amlodipine 5 mg but reports some lightheadedness and fatigue when he takes it. He has tried taking every other day instead and reports some improvement in symptoms  Will decrease to Amlodipine 2.5 mg PO every day so he can take daily  Continue with Lisinopril 40 mg PO every day and hydrochlorothiazide 25 mg PO every day Recommend he continues taking BP daily at home for monitoring  Follow up in 3 months or sooner if concerns arise

## 2023-02-05 NOTE — Progress Notes (Signed)
Acute Office Visit   Patient: Luke Griffin   DOB: 06-30-1960   62 y.o. Male  MRN: [id number] Visit Date: 02/05/2023  Today's healthcare provider: Oswaldo Conroy Samad Thon, PA-C  Introduced myself to the patient as a Secondary school teacher and provided education on APPs in clinical practice.    Chief Complaint  Patient presents with   Hypertension    Patient says the medication has been doing well. Patient says when he takes the medication in the evening around 5 pm. He will notice some dizziness and he is extremely tired and notice his blood pressure is around 115/69. Patient says when he takes the medications every other day he will not notice any problems.    Subjective    HPI HPI     Hypertension    Additional comments: Patient says the medication has been doing well. Patient says when he takes the medication in the evening around 5 pm. He will notice some dizziness and he is extremely tired and notice his blood pressure is around 115/69. Patient says when he takes the medications every other day he will not notice any problems.       Last edited by Malen Gauze, CMA on 02/05/2023  3:11 PM.        HTN   He reports that his BP has improved since adding Amlodipine 5 mg  States BP last night was 115/69 and he felt dizzy and tired afterward He has been trying to take every other day to help improve the dizziness   He has been taking Lisinopril and hydrochlorothiazide every day as directed   He has been taking BP everyday and reports he has noticed that BP is decreasing since starting amlodipine     Medications: Outpatient Medications Prior to Visit  Medication Sig   albuterol (VENTOLIN HFA) 108 (90 Base) MCG/ACT inhaler INHALE 2 PUFFS BY MOUTH EVERY 6 HOURS AS NEEDED FOR WHEEZING FOR SHORTNESS OF BREATH   fluticasone (FLONASE) 50 MCG/ACT nasal spray Place 2 sprays into both nostrils daily.   hydrochlorothiazide (HYDRODIURIL) 25 MG tablet Take 1 tablet (25 mg total) by mouth daily.    hydrOXYzine (VISTARIL) 25 MG capsule Take 1 capsule (25 mg total) by mouth as needed.   lisinopril (ZESTRIL) 40 MG tablet Take 1 tablet (40 mg total) by mouth daily.   rosuvastatin (CRESTOR) 5 MG tablet Take 1 tablet (5 mg total) by mouth daily.   tadalafil (CIALIS) 5 MG tablet Take 1 tablet (5 mg total) by mouth daily.   [DISCONTINUED] amLODipine (NORVASC) 5 MG tablet Take 1 tablet (5 mg total) by mouth daily.   pantoprazole (PROTONIX) 40 MG tablet Take 1 tablet (40 mg total) by mouth daily.   No facility-administered medications prior to visit.    Review of Systems  Constitutional:  Negative for chills and fever.  Respiratory:  Negative for shortness of breath.   Cardiovascular:  Negative for chest pain, palpitations and leg swelling.  Neurological:  Positive for dizziness and light-headedness. Negative for facial asymmetry and numbness.        Objective    BP 138/81   Pulse 87   Ht 5\' 7"  (1.702 m)   Wt 183 lb 12.8 oz (83.4 kg)   SpO2 97%   BMI 28.79 kg/m     Physical Exam Vitals reviewed.  Constitutional:      General: He is awake.     Appearance: Normal appearance. He is well-developed and well-groomed.  HENT:  Head: Normocephalic and atraumatic.  Cardiovascular:     Rate and Rhythm: Normal rate and regular rhythm.     Pulses: Normal pulses.     Heart sounds: Normal heart sounds. No murmur heard.    No friction rub. No gallop.  Pulmonary:     Effort: Pulmonary effort is normal.     Breath sounds: Normal breath sounds. No decreased air movement. No decreased breath sounds, wheezing, rhonchi or rales.  Musculoskeletal:        General: Normal range of motion.     Cervical back: Normal range of motion.     Right lower leg: No edema.     Left lower leg: No edema.  Skin:    General: Skin is warm and dry.  Neurological:     General: No focal deficit present.     Mental Status: He is alert and oriented to person, place, and time.  Psychiatric:        Behavior:  Behavior is cooperative.       No results found for any visits on 02/05/23.  Assessment & Plan      Return in about 3 months (around 05/08/2023) for HTN, HLD, prediabetes, labs.     Problem List Items Addressed This Visit       Cardiovascular and Mediastinum   Primary hypertension - Primary    Chronic, historic condition Appears to be improving with addition of Amlodipine 5 mg but reports some lightheadedness and fatigue when he takes it. He has tried taking every other day instead and reports some improvement in symptoms  Will decrease to Amlodipine 2.5 mg PO every day so he can take daily  Continue with Lisinopril 40 mg PO every day and hydrochlorothiazide 25 mg PO every day Recommend he continues taking BP daily at home for monitoring  Follow up in 3 months or sooner if concerns arise        Relevant Medications   amLODipine (NORVASC) 2.5 MG tablet     Return in about 3 months (around 05/08/2023) for HTN, HLD, prediabetes, labs.   I, Joanie Duprey E Sidnee Gambrill, PA-C, have reviewed all documentation for this visit. The documentation on 02/05/23 for the exam, diagnosis, procedures, and orders are all accurate and complete.   Jacquelin Hawking, MHS, PA-C [facility]

## 2023-04-30 ENCOUNTER — Ambulatory Visit (INDEPENDENT_AMBULATORY_CARE_PROVIDER_SITE_OTHER): Payer: Self-pay | Admitting: Pediatrics

## 2023-04-30 ENCOUNTER — Encounter: Payer: Self-pay | Admitting: Pediatrics

## 2023-04-30 VITALS — BP 159/90 | HR 74 | Temp 97.7°F | Ht 67.0 in | Wt 185.6 lb

## 2023-04-30 DIAGNOSIS — Z5971 Insufficient health insurance coverage: Secondary | ICD-10-CM

## 2023-04-30 DIAGNOSIS — J441 Chronic obstructive pulmonary disease with (acute) exacerbation: Secondary | ICD-10-CM

## 2023-04-30 DIAGNOSIS — N529 Male erectile dysfunction, unspecified: Secondary | ICD-10-CM

## 2023-04-30 DIAGNOSIS — R030 Elevated blood-pressure reading, without diagnosis of hypertension: Secondary | ICD-10-CM

## 2023-04-30 MED ORDER — PREDNISONE 20 MG PO TABS: 40.0000 mg | ORAL_TABLET | Freq: Every day | ORAL | 0 refills | Status: AC

## 2023-04-30 MED ORDER — TADALAFIL 5 MG PO TABS: 5.0000 mg | ORAL_TABLET | Freq: Every day | ORAL | 3 refills | Status: DC

## 2023-04-30 MED ORDER — AZITHROMYCIN 250 MG PO TABS: ORAL_TABLET | ORAL | 0 refills | Status: AC

## 2023-04-30 NOTE — Patient Instructions (Addendum)
COPD EXACERBATION: - start z-pack and prednisone (2 pilss a day) for 5 days for COPD exacerbation  Measure your blood pressure at home! Home Blood Pressure Monitoring is an important part of managing blood pressure and thought to be more accurate than the measures we get in the clinic.  Here's some tips on how to take your blood pressure accurately at home and some highly rated monitors. Most insurances (except for Medicaid) won't pay for monitors, so unfortunately they are an out-of-pocket expense for most people.  Taking an accurate blood pressure measurement: To get an accurate blood pressure reading, empty your bladder first, then rest in a seated position for at least 5 minutes. Ideally, no caffeine or tobacco use in last 30 minutes. Use an arm cuff (not wrist - see recommendations below) seated in a chair with a back next to a table or object that is high enough that you can rest your arm so the blood pressure cuff is at the level of your heart and you can lean back comfortably. Keep both feet on the floor and don't talk while the machine is working. Checking at different times of the day can be helpful to get an idea of your average numbers. Your goal blood pressure should be <140/90. BP Monitor Ratings: Here are some top rated Blood Pressure Monitors as tested by Consumer Reports (accessed January, 2024): (*BB) Best buy = highly rated with lower price Rating Brand/Model Estimated Cost  86 [employer] Platinum BP5450 $79  85 [employer]ron Silver BP5250 (*BB)  $53  84 Omron 10 Series BP7450 $92  83 [employer]ron Evolv BP7000 $110  81 [employer] [id number] $52  80 Omron 3 Series BP7100 $50  78 [employer] [id number] $40  76 [employer] ([employer]) Automatic Upper Arm 48-554 $30  Note: all units listed above are arm cuffs which are more accurate than wrist cuffs. For more information (and the source of the below info-graphic on how to get set up to get an accurate reading) - check out:  [employer]be

## 2023-04-30 NOTE — Progress Notes (Signed)
Acute Visit  BP (!) 159/90 (BP Location: Left Arm, Patient Position: Sitting, Cuff Size: Normal)   Pulse 74   Temp 97.7 F (36.5 C) (Oral)   Ht 5\' 7"  (1.702 m)   Wt 185 lb 9.6 oz (84.2 kg)   SpO2 95%   BMI 29.07 kg/m    Subjective:    Patient ID: Luke Griffin, male    DOB: Jul 17, 1960, 62 y.o.   MRN: [id number]  HPI: Luke Griffin is a 62 y.o. male  Chief Complaint  Patient presents with   Nasal Congestion    Coughing up phlegm, difficulty breathing    3 month follow up   Medication Refill    Cialis    #congestion, cough Patient reports increased nasal congestion for the last 2 weeks that has been worsening He reports increased cough with wheezing, increased green-ish sputum production His significant other is sick right now, undergoing COPD/CAP treatment No formal h/o COPD, he is an every day smoker Has been using nebulizer at home and over the counter medications for cough/congestion with minimal improvement Has felt chills, does not check temperature Denies nausea, vomiting  #ED Requesting cialis refills  Relevant past medical, surgical, family and social history reviewed and updated as indicated. Interim medical history since our last visit reviewed. Allergies and medications reviewed and updated.  ROS per HPI unless specifically indicated above     Objective:    BP (!) 159/90 (BP Location: Left Arm, Patient Position: Sitting, Cuff Size: Normal)   Pulse 74   Temp 97.7 F (36.5 C) (Oral)   Ht 5\' 7"  (1.702 m)   Wt 185 lb 9.6 oz (84.2 kg)   SpO2 95%   BMI 29.07 kg/m   Wt Readings from Last 3 Encounters:  04/30/23 185 lb 9.6 oz (84.2 kg)  02/05/23 183 lb 12.8 oz (83.4 kg)  01/08/23 184 lb 6.4 oz (83.6 kg)     Physical Exam Constitutional:      Appearance: Normal appearance.  HENT:     Head: Normocephalic and atraumatic.  Eyes:     Pupils: Pupils are equal, round, and reactive to light.  Cardiovascular:     Rate and Rhythm: Normal rate and  regular rhythm.     Pulses: Normal pulses.     Heart sounds: Normal heart sounds.  Pulmonary:     Effort: Pulmonary effort is normal.     Breath sounds: Wheezing present.     Comments: Wheezing throughout bilaterally lung fields on inspiration and expiration Abdominal:     General: Abdomen is flat.     Palpations: Abdomen is soft.  Musculoskeletal:        General: Normal range of motion.     Cervical back: Normal range of motion.  Skin:    General: Skin is warm and dry.     Capillary Refill: Capillary refill takes less than 2 seconds.  Neurological:     General: No focal deficit present.     Mental Status: He is alert. Mental status is at baseline.  Psychiatric:        Mood and Affect: Mood normal.        Behavior: Behavior normal.        Assessment & Plan:  Assessment & Plan   COPD exacerbation (HCC) Smoker presenting with what sounds like a COPD exacerbation. Wheezy along all lung fields. No formal COPD diagnosis (recommend spirometry next visit) though strong suspicion. Plan to treat with pred and abx as below. Continue nebulizer treatments  at home. Pt given strict return precautions and anticipatory guidance for ED. -     predniSONE; Take 2 tablets (40 mg total) by mouth daily with breakfast for 5 days.  Dispense: 10 tablet; Refill: 0 -     Azithromycin; Take 2 tablets on day 1, then 1 tablet daily on days 2 through 5  Dispense: 6 tablet; Refill: 0  Elevated blood pressure Suspect due to illness. Will check at home. Determine if needs medication at follow up.  Erectile dysfunction, unspecified erectile dysfunction type Chronic refills requested. -     Tadalafil; Take 1 tablet (5 mg total) by mouth daily.  Dispense: 90 tablet; Refill: 3  Does not have health insurance Asked for assistance enrolling in health insurance. -     AMB Referral [facility] Care Management  Follow up plan: Return in about 3 months (around 07/31/2023) for HTN.  Kha Hari Howell Pringle, MD

## 2023-05-01 ENCOUNTER — Telehealth: Payer: Self-pay | Admitting: *Deleted

## 2023-05-01 NOTE — Progress Notes (Signed)
  Care Coordination   Note   05/01/2023 Name: Franchot Naso MRN: [id number] DOB: 30-Jan-1961  Luke Griffin is a 62 y.o. year old male who sees Larae Grooms, NP for primary care. I reached out to [employer] by phone today to offer care coordination services.  Luke Griffin was given information about Care Coordination services today including:   The Care Coordination services include support from the care team which includes your Nurse Coordinator, Clinical Social Worker, or Pharmacist.  The Care Coordination team is here to help remove barriers to the health concerns and goals most important to you. Care Coordination services are voluntary, and the patient may decline or stop services at any time by request to their care team member.   Care Coordination Consent Status: Patient agreed to services and verbal consent obtained.   Follow up plan:  Telephone appointment with care coordination team member scheduled for:  05/08/2023  Encounter Outcome:  Patient Scheduled from referral   Burman Nieves, [facility] Care Coordination Care Guide Direct Dial: [phone]

## 2023-05-05 ENCOUNTER — Encounter: Payer: Self-pay | Admitting: Pediatrics

## 2023-05-08 ENCOUNTER — Ambulatory Visit: Payer: Self-pay | Admitting: Nurse Practitioner

## 2023-05-08 ENCOUNTER — Ambulatory Visit: Payer: Self-pay

## 2023-05-08 NOTE — Patient Outreach (Signed)
  Care Coordination   05/08/2023 Name: Luke Griffin MRN: [id number] DOB: Sep 04, 1960   Care Coordination Outreach Attempts:  An unsuccessful telephone outreach was attempted for a scheduled appointment today.  Follow Up Plan:  Additional outreach attempts will be made to offer the patient care coordination information and services.   Encounter Outcome:  No Answer   Care Coordination Interventions:  No, not indicated    SIG Lysle Morales, BSW Social Worker [phone]

## 2023-05-12 ENCOUNTER — Telehealth: Payer: Self-pay | Admitting: *Deleted

## 2023-05-12 NOTE — Progress Notes (Unsigned)
  Care Coordination Note  05/12/2023 Name: [name] MRN: [id number] DOB: Jun 05, 1961  Luke Griffin is a 62 y.o. year old male who is a primary care patient of Larae Grooms, NP and is actively engaged with the care management team. I reached out to [employer] by phone today to assist with re-scheduling an initial visit with the BSW  Follow up plan: Unsuccessful telephone outreach attempt made. A HIPAA compliant phone message was left for the patient providing contact information and requesting a return call.   Burman Nieves, CCMA Care Coordination Care Guide Direct Dial: [phone]

## 2023-05-13 NOTE — Progress Notes (Signed)
  Care Coordination Note  05/13/2023 Name: [name] MRN: [id number] DOB: [birth year]  Luke Griffin is a 62 y.o. year old male who is a primary care patient of Larae Grooms, NP and is actively engaged with the care management team. I reached out to [employer] by phone today to assist with re-scheduling an initial visit with the BSW  Follow up plan: We have been unable to make contact with the patient for follow up.   Burman Nieves, CCMA Care Coordination Care Guide Direct Dial: [phone]

## 2023-05-25 ENCOUNTER — Ambulatory Visit: Payer: Self-pay

## 2023-05-25 NOTE — Telephone Encounter (Signed)
Chief Complaint: COPD exacerbation Symptoms: Moderate SOB, congestion, cough, sore chest, wheezing Frequency: constant Pertinent Negatives: Patient denies fever, nausea, vomiting  Disposition: [] ED /[] Urgent Care (no appt availability in office) / [x] Appointment(In office/virtual)/ []  [facility]/ [] Home Care/ [] Refused Recommended Disposition /[] [facility]/ []  Follow-up with PCP Additional Notes: Patient stated he was seen in office on 04/30/23 and was treated for COPD exacerbation with azithromycin and prednisone. Patient states he completed the course of antibiotics but his symptoms are still the same. Patient states he usually is given an injection of Rocephin when he has a COPD flare up and it helps but it was not available in the office this time. Care advice was given and patient has been scheduled for a follow-up appointment with PCP on 05/27/23.  Reason for Disposition  [1] Taking antibiotic > 72 hours (3 days) AND [2] symptoms (other than fever) not improved  Answer Assessment - Initial Assessment Questions 1. INFECTION: "What infection is the antibiotic being given for?"     COPD flare up 2. ANTIBIOTIC: "What antibiotic are you taking" "How many times per day?"     Zithromax Z-Pack 3. DURATION: "When was the antibiotic started?"     04/30/23 4. MAIN CONCERN OR SYMPTOM:  "What is your main concern right now?"     SOB 5. BETTER-SAME-WORSE: "Are you getting better, staying the same, or getting worse compared to when you first started the antibiotics?" If getting worse, ask: "In what way?"      Same 6. FEVER: "Do you have a fever?" If Yes, ask: "What is your temperature, how was it measured, and when did it start?"     I'm not sure but I felt warm this morning 7. SYMPTOMS: "Are there any other symptoms you're concerned about?" If Yes, ask: "When did it start?"     SOB, sore chest when taking a deep breathe, coughing up septum  8. FOLLOW-UP APPOINTMENT: "Do you  have a follow-up appointment with your doctor?"     No  Protocols used: Infection on Antibiotic Follow-up Call-A-AH

## 2023-05-27 ENCOUNTER — Ambulatory Visit (INDEPENDENT_AMBULATORY_CARE_PROVIDER_SITE_OTHER): Payer: Self-pay | Admitting: Nurse Practitioner

## 2023-05-27 ENCOUNTER — Encounter: Payer: Self-pay | Admitting: Nurse Practitioner

## 2023-05-27 ENCOUNTER — Telehealth: Payer: Self-pay

## 2023-05-27 VITALS — BP 132/82 | HR 78 | Temp 98.1°F | Ht 67.5 in | Wt 184.4 lb

## 2023-05-27 DIAGNOSIS — J441 Chronic obstructive pulmonary disease with (acute) exacerbation: Secondary | ICD-10-CM

## 2023-05-27 MED ORDER — CEFTRIAXONE SODIUM 500 MG IJ SOLR
500.0000 mg | Freq: Once | INTRAMUSCULAR | Status: AC
  Administered 2023-05-27: 500 mg via INTRAMUSCULAR

## 2023-05-27 MED ORDER — PREDNISONE 20 MG PO TABS: 20.0000 mg | ORAL_TABLET | Freq: Every day | ORAL | 0 refills | Status: DC

## 2023-05-27 NOTE — Telephone Encounter (Signed)
Pharmacist Revonda Standard at [facility] called for clarification on the Prednisone. Is pt to take 1 tab daily for 10 days or 2 tablets daily for 5 days.

## 2023-05-27 NOTE — Telephone Encounter (Signed)
I'm getting an error message when trying to send the prescription again.  The dose is 40mg  daily for 5 days.

## 2023-05-27 NOTE — Telephone Encounter (Signed)
Called pharmacy and clarified prescription with Revonda Standard.

## 2023-05-27 NOTE — Progress Notes (Signed)
Patient presented to clinic for a provider visit forCeftriaxine injectio per provider order. 350 mcg of ceftriaxone administered to right Ventrogluteal, per patient request. Patient tolerated well, no complications encountered.

## 2023-05-27 NOTE — Progress Notes (Signed)
BP 132/82 (BP Location: Left Arm, Patient Position: Sitting, Cuff Size: Normal)   Pulse 78   Temp 98.1 F (36.7 C) (Oral)   Ht 5' 7.5" (1.715 m)   Wt 184 lb 6.4 oz (83.6 kg)   SpO2 96%   BMI 28.45 kg/m    Subjective:    Patient ID: Luke Griffin, male    DOB: Jul 30, 1960, 62 y.o.   MRN: [id number]  HPI: Luke Griffin is a 62 y.o. male  Chief Complaint  Patient presents with   COPD    Hurting up phlem for about 3 days, chest hurts due to coughing   Follow-up   Shortness of Breath    Sharpe pains in the lungs when breathing, Antibiotics helped and once the round was ver, symptoms came back    COPD Patient states he was here a few weeks ago and treated for COPD exacerbation.  He was treated with prednisone and azithromycin.  Symptoms had improved for about a week then states the symptoms returned.  He is coughing up phlegm, shortness of breath.  States he took 4 aleve yesterday because he felt warm.  He has been having some headaches.    Relevant past medical, surgical, family and social history reviewed and updated as indicated. Interim medical history since our last visit reviewed. Allergies and medications reviewed and updated.  Review of Systems  Constitutional:  Positive for fatigue.  HENT:  Positive for congestion.   Respiratory:  Positive for cough and shortness of breath.   Neurological:  Positive for headaches.    Per HPI unless specifically indicated above     Objective:    BP 132/82 (BP Location: Left Arm, Patient Position: Sitting, Cuff Size: Normal)   Pulse 78   Temp 98.1 F (36.7 C) (Oral)   Ht 5' 7.5" (1.715 m)   Wt 184 lb 6.4 oz (83.6 kg)   SpO2 96%   BMI 28.45 kg/m   Wt Readings from Last 3 Encounters:  05/27/23 184 lb 6.4 oz (83.6 kg)  04/30/23 185 lb 9.6 oz (84.2 kg)  02/05/23 183 lb 12.8 oz (83.4 kg)    Physical Exam Vitals and nursing note reviewed.  Constitutional:      General: He is not in acute distress.    Appearance: Normal  appearance. He is not ill-appearing, toxic-appearing or diaphoretic.  HENT:     Head: Normocephalic.     Right Ear: External ear normal.     Left Ear: External ear normal.     Nose: Nose normal. No congestion or rhinorrhea.     Mouth/Throat:     Mouth: Mucous membranes are moist.  Eyes:     General:        Right eye: No discharge.        Left eye: No discharge.     Extraocular Movements: Extraocular movements intact.     Conjunctiva/sclera: Conjunctivae normal.     Pupils: Pupils are equal, round, and reactive to light.  Cardiovascular:     Rate and Rhythm: Normal rate and regular rhythm.     Heart sounds: No murmur heard. Pulmonary:     Effort: Pulmonary effort is normal. No respiratory distress.     Breath sounds: Examination of the left-upper field reveals wheezing. Examination of the left-middle field reveals wheezing. Examination of the right-lower field reveals wheezing and rhonchi. Examination of the left-lower field reveals wheezing. Wheezing and rhonchi present. No rales.  Abdominal:     General: Abdomen is flat. Bowel  sounds are normal.  Musculoskeletal:     Cervical back: Normal range of motion and neck supple.  Skin:    General: Skin is warm and dry.     Capillary Refill: Capillary refill takes less than 2 seconds.  Neurological:     General: No focal deficit present.     Mental Status: He is alert and oriented to person, place, and time.  Psychiatric:        Mood and Affect: Mood normal.        Behavior: Behavior normal.        Thought Content: Thought content normal.        Judgment: Judgment normal.     Results for orders placed or performed in visit on 12/01/22  Comp Met (CMET)  Result Value Ref Range   Glucose 99 70 - 99 mg/dL   BUN 9 6 - 24 mg/dL   Creatinine, Ser 8.65 0.76 - 1.27 mg/dL   eGFR 84 >78 IO/NGE/9.52   BUN/Creatinine Ratio 9 9 - 20   Sodium 141 134 - 144 mmol/L   Potassium 3.8 3.5 - 5.2 mmol/L   Chloride 100 96 - 106 mmol/L   CO2 27 20 -  29 mmol/L   Calcium 9.8 8.7 - 10.2 mg/dL   Total Protein 7.4 6.0 - 8.5 g/dL   Albumin 4.8 3.8 - 4.9 g/dL   Globulin, Total 2.6 1.5 - 4.5 g/dL   Bilirubin Total 0.5 0.0 - 1.2 mg/dL   Alkaline Phosphatase 85 44 - 121 IU/L   AST 26 0 - 40 IU/L   ALT 33 0 - 44 IU/L  HgB A1c  Result Value Ref Range   Hgb A1c MFr Bld 6.7 (H) 4.8 - 5.6 %   Est. average glucose Bld gHb Est-mCnc 146 mg/dL  Lipid Profile  Result Value Ref Range   Cholesterol, Total 228 (H) 100 - 199 mg/dL   Triglycerides 841 (H) 0 - 149 mg/dL   HDL 41 >32 mg/dL   VLDL Cholesterol Cal 41 (H) 5 - 40 mg/dL   LDL Chol Calc (NIH) 440 (H) 0 - 99 mg/dL   Chol/HDL Ratio 5.6 (H) 0.0 - 5.0 ratio      Assessment & Plan:   Problem List Items Addressed This Visit   None Visit Diagnoses     COPD exacerbation (HCC)    -  Primary   Exacberbated x 3 days. Will order check xray to rule out Pneumonia. Ceftriaxone given in office.  Repeated prednisone course. FU if not improved.   Relevant Medications   cefTRIAXone (ROCEPHIN) injection 500 mg (Start on 05/27/2023  2:30 PM)   predniSONE (DELTASONE) 20 MG tablet   Other Relevant Orders   DG Chest 2 View        Follow up plan: No follow-ups on file.

## 2023-05-27 NOTE — Addendum Note (Signed)
Addended by: Larae Grooms on: 05/27/2023 03:52 PM   Modules accepted: Orders

## 2023-05-29 ENCOUNTER — Other Ambulatory Visit: Payer: Self-pay | Admitting: Physician Assistant

## 2023-05-29 DIAGNOSIS — I10 Essential (primary) hypertension: Secondary | ICD-10-CM

## 2023-05-29 NOTE — Telephone Encounter (Signed)
Requested Prescriptions  Pending Prescriptions Disp Refills   amLODipine (NORVASC) 2.5 MG tablet [Pharmacy Med Name: amLODIPine Besylate 2.5 MG Oral Tablet] 30 tablet 0    Sig: Take 1 tablet by mouth once daily     Cardiovascular: Calcium Channel Blockers 2 Passed - 05/29/2023  1:48 PM      Passed - Last BP in normal range    BP Readings from Last 1 Encounters:  05/27/23 132/82         Passed - Last Heart Rate in normal range    Pulse Readings from Last 1 Encounters:  05/27/23 78         Passed - Valid encounter within last 6 months    Recent Outpatient Visits           2 days ago COPD exacerbation (HCC)   [place] [facility] Larae Grooms, NP   4 weeks ago COPD exacerbation [facility])   [place] [facility] Jackolyn Confer, MD   3 months ago Primary hypertension   [place] [facility] Mecum, Oswaldo Conroy, PA-C   4 months ago Primary hypertension   [place] [facility] Mecum, Oswaldo Conroy, PA-C   5 months ago Primary hypertension   [place] [facility] Larae Grooms, NP       Future Appointments             In 2 months Larae Grooms, NP [facility]

## 2023-06-03 ENCOUNTER — Other Ambulatory Visit: Payer: Self-pay | Admitting: Nurse Practitioner

## 2023-06-03 NOTE — Telephone Encounter (Signed)
Requested Prescriptions  Pending Prescriptions Disp Refills   albuterol (VENTOLIN HFA) 108 (90 Base) MCG/ACT inhaler [Pharmacy Med Name: Albuterol Sulfate HFA 108 (90 Base) MCG/ACT Inhalation Aerosol Solution] 8 g 0    Sig: INHALE 2 PUFFS BY MOUTH EVERY 6 HOURS AS NEEDED FOR WHEEZING FOR SHORTNESS OF BREATH     Pulmonology:  Beta Agonists 2 Passed - 06/03/2023  1:59 PM      Passed - Last BP in normal range    BP Readings from Last 1 Encounters:  05/27/23 132/82         Passed - Last Heart Rate in normal range    Pulse Readings from Last 1 Encounters:  05/27/23 78         Passed - Valid encounter within last 12 months    Recent Outpatient Visits           1 week ago COPD exacerbation (HCC)   [place] [facility] Larae Grooms, NP   1 month ago COPD exacerbation [facility])   [place] [facility] Jackolyn Confer, MD   3 months ago Primary hypertension   [place] [facility] Mecum, Oswaldo Conroy, PA-C   4 months ago Primary hypertension   [place] [facility] Mecum, Oswaldo Conroy, PA-C   6 months ago Primary hypertension   [place] [facility] Larae Grooms, NP       Future Appointments             In 1 month Larae Grooms, NP  [facility], [facility]

## 2023-06-04 ENCOUNTER — Other Ambulatory Visit: Payer: Self-pay | Admitting: Nurse Practitioner

## 2023-06-04 MED ORDER — HYDROXYZINE PAMOATE 25 MG PO CAPS: 25.0000 mg | ORAL_CAPSULE | ORAL | 0 refills | Status: DC | PRN

## 2023-06-04 NOTE — Telephone Encounter (Signed)
Medication Refill -  Most Recent Primary Care Visit:  Provider: Larae Grooms  Department: [facility]  Visit Type: OFFICE VISIT  Date: 05/27/2023  Medication: hydrOXYzine (VISTARIL) 25 MG capsule  Has the patient contacted their pharmacy? Yes  Is this the correct pharmacy for this prescription? Yes If no, delete pharmacy and type the correct one.  This is the patient's preferred pharmacy:  [facility] [address] ([address]), [place] - [address] [place]) [place] [address] Phone: [phone] Fax: [phone]   Has the prescription been filled recently? Yes  Is the patient out of the medication? No  1 pill left   Has the patient been seen for an appointment in the last year OR does the patient have an upcoming appointment? Yes  Can we respond through MyChart? No  Agent: Please be advised that Rx refills may take up to 3 business days. We ask that you follow-up with your pharmacy.

## 2023-06-04 NOTE — Telephone Encounter (Signed)
Requested Prescriptions  Pending Prescriptions Disp Refills   hydrOXYzine (VISTARIL) 25 MG capsule 30 capsule 0    Sig: Take 1 capsule (25 mg total) by mouth as needed.     Ear, Nose, and Throat:  Antihistamines 2 Passed - 06/04/2023 11:55 AM      Passed - Cr in normal range and within 360 days    Creatinine, Ser  Date Value Ref Range Status  12/01/2022 1.03 0.76 - 1.27 mg/dL Final         Passed - Valid encounter within last 12 months    Recent Outpatient Visits           1 week ago COPD exacerbation (HCC)   [place] [facility] Larae Grooms, NP   1 month ago COPD exacerbation [facility])   [place] [facility] Jackolyn Confer, MD   3 months ago Primary hypertension   [place] [facility] Mecum, Oswaldo Conroy, PA-C   4 months ago Primary hypertension   [place] [facility] Mecum, Oswaldo Conroy, PA-C   6 months ago Primary hypertension   [place] [facility] Larae Grooms, NP       Future Appointments             In 1 month Larae Grooms, NP  [facility], [facility]

## 2023-06-17 ENCOUNTER — Other Ambulatory Visit: Payer: Self-pay | Admitting: Nurse Practitioner

## 2023-06-22 NOTE — Telephone Encounter (Signed)
 Requested Prescriptions  Pending Prescriptions Disp Refills   lisinopril  (ZESTRIL ) 40 MG tablet [Pharmacy Med Name: Lisinopril  40 MG Oral Tablet] 90 tablet 0    Sig: Take 1 tablet by mouth once daily     Cardiovascular:  ACE Inhibitors Failed - 06/22/2023  7:51 AM      Failed - Cr in normal range and within 180 days    Creatinine, Ser  Date Value Ref Range Status  12/01/2022 1.03 0.76 - 1.27 mg/dL Final         Failed - K in normal range and within 180 days    Potassium  Date Value Ref Range Status  12/01/2022 3.8 3.5 - 5.2 mmol/L Final         Passed - Patient is not pregnant      Passed - Last BP in normal range    BP Readings from Last 1 Encounters:  05/27/23 132/82         Passed - Valid encounter within last 6 months    Recent Outpatient Visits           3 weeks ago COPD exacerbation (HCC)   [place] [facility] Melvin Pao, NP   1 month ago COPD exacerbation [facility])   [place] [facility] Herold Hadassah SQUIBB, MD   4 months ago Primary hypertension   [place] [facility] Mecum, Rocky BRAVO, PA-C   5 months ago Primary hypertension   [place] [facility] Mecum, Rocky BRAVO, PA-C   6 months ago Primary hypertension   [place] [facility] Melvin Pao, NP       Future Appointments             In 1 month Melvin Pao, NP [place] [facility], [facility]

## 2023-07-08 ENCOUNTER — Other Ambulatory Visit: Payer: Self-pay | Admitting: Nurse Practitioner

## 2023-07-08 ENCOUNTER — Ambulatory Visit (INDEPENDENT_AMBULATORY_CARE_PROVIDER_SITE_OTHER): Payer: Self-pay | Admitting: Nurse Practitioner

## 2023-07-08 ENCOUNTER — Ambulatory Visit: Payer: Self-pay

## 2023-07-08 ENCOUNTER — Encounter: Payer: Self-pay | Admitting: Nurse Practitioner

## 2023-07-08 VITALS — BP 158/102 | HR 78 | Temp 97.4°F | Ht 67.2 in | Wt 185.2 lb

## 2023-07-08 DIAGNOSIS — R7303 Prediabetes: Secondary | ICD-10-CM

## 2023-07-08 DIAGNOSIS — I10 Essential (primary) hypertension: Secondary | ICD-10-CM

## 2023-07-08 MED ORDER — AMLODIPINE BESYLATE 5 MG PO TABS: 5.0000 mg | ORAL_TABLET | Freq: Every day | ORAL | 0 refills | Status: DC

## 2023-07-08 MED ORDER — HYDRALAZINE HCL 10 MG PO TABS: 10.0000 mg | ORAL_TABLET | Freq: Two times a day (BID) | ORAL | 0 refills | Status: DC | PRN

## 2023-07-08 NOTE — Telephone Encounter (Signed)
Requested Prescriptions  Pending Prescriptions Disp Refills   hydrOXYzine (VISTARIL) 25 MG capsule [Pharmacy Med Name: hydrOXYzine Pamoate 25 MG Oral Capsule] 90 capsule 0    Sig: TAKE 1 CAPSULE BY MOUTH AS NEEDED     Ear, Nose, and Throat:  Antihistamines 2 Passed - 07/08/2023  1:23 PM      Passed - Cr in normal range and within 360 days    Creatinine, Ser  Date Value Ref Range Status  12/01/2022 1.03 0.76 - 1.27 mg/dL Final         Passed - Valid encounter within last 12 months    Recent Outpatient Visits           1 month ago COPD exacerbation (HCC)   [place] [employer] Larae Grooms, NP   2 months ago COPD exacerbation [facility])   [place] [facility] Jackolyn Confer, MD   5 months ago Primary hypertension   [place] [facility] Mecum, Oswaldo Conroy, PA-C   6 months ago Primary hypertension   [place] [facility] Mecum, Oswaldo Conroy, PA-C   7 months ago Primary hypertension   [place] [facility] Larae Grooms, NP       Future Appointments             In 3 weeks Larae Grooms, NP [place] [facility], [facility]

## 2023-07-08 NOTE — Telephone Encounter (Signed)
Requested Prescriptions  Pending Prescriptions Disp Refills   albuterol (VENTOLIN HFA) 108 (90 Base) MCG/ACT inhaler [Pharmacy Med Name: Albuterol Sulfate HFA 108 (90 Base) MCG/ACT Inhalation Aerosol Solution] 9 g 0    Sig: INHALE 2 PUFFS BY MOUTH EVERY 6 HOURS AS NEEDED FOR WHEEZING FOR SHORTNESS OF BREATH     Pulmonology:  Beta Agonists 2 Passed - 07/08/2023  1:21 PM      Passed - Last BP in normal range    BP Readings from Last 1 Encounters:  05/27/23 132/82         Passed - Last Heart Rate in normal range    Pulse Readings from Last 1 Encounters:  05/27/23 78         Passed - Valid encounter within last 12 months    Recent Outpatient Visits           1 month ago COPD exacerbation (HCC)   [place] [facility] Larae Grooms, NP   2 months ago COPD exacerbation [facility])   [place] [facility] Jackolyn Confer, MD   5 months ago Primary hypertension   [place] [facility] Mecum, Oswaldo Conroy, PA-C   6 months ago Primary hypertension   [place] [facility] Mecum, Oswaldo Conroy, PA-C   7 months ago Primary hypertension   [place] [facility] Larae Grooms, NP       Future Appointments             In 3 weeks Larae Grooms, NP [place] [facility], [facility]

## 2023-07-08 NOTE — Assessment & Plan Note (Signed)
Concern that he may be trending up on A1c based on symptoms recently.  Has strong family history.  Will recheck A1c today and initiate medication as needed based on result.  Discussed at length with patient today.

## 2023-07-08 NOTE — Progress Notes (Signed)
BP (!) 158/102 (BP Location: Left Arm, Patient Position: Sitting, Cuff Size: Normal)   Pulse 78   Temp (!) 97.4 F (36.3 C) (Oral)   Ht 5' 7.2" (1.707 m)   Wt 185 lb 3.2 oz (84 kg)   SpO2 99%   BMI 28.83 kg/m    Subjective:    Patient ID: Luke Griffin, male    DOB: June 22, 1960, 63 y.o.   MRN: [id number]  HPI: Luke Griffin is a 63 y.o. male  Chief Complaint  Patient presents with   Hypertension    Patient states he has been getting elevated BP readings since Saturday. States he has changed his diet and has been exercising so he doesn't understand why his BP is up. States he has felt very fatigued since Saturday as well.    HYPERTENSION without Chronic Kidney Disease Currently taking Amlodipine, Lisinopril, and HCTZ.  Had Amlodipine at 5 MG in past but during that time his blood pressures were stable and it made him dizzy so they lowered to 2.5 MG daily.  Starting on Saturday his BP levels began to trend up.  Denies any changes to diet.  Reports taking medication every day.  On one day he took 2 Lisinopril.  On Saturday was dizzy and then the following days was really tired.    He is a current smoker, smoking 1/2 PPD at baseline.  Denies any alcohol of drug use.   Hypertension status: uncontrolled  Satisfied with current treatment? yes Duration of hypertension: chronic BP monitoring frequency:  daily BP range: until Saturday has been in 140/80 range, but now 180-190/100-110 range BP medication side effects:  no Medication compliance: good compliance Aspirin: no Recurrent headaches: no Visual changes: yes -- Saturday with blurred vision Palpitations: no Dyspnea: no Chest pain: no Lower extremity edema: no Dizzy/lightheaded: no   Impaired Fasting Glucose Had last A1c check in June 2024 and missed follow-up after this.  Has been working on diet, cutting on hamburgers.  HbA1C:  Lab Results  Component Value Date   HGBA1C 6.7 (H) 12/01/2022  Duration of elevated blood  sugar: months Polydipsia: yes  Polyuria: no Weight change: no Visual disturbance: yes -- on Saturday Glucose Monitoring: no    Accucheck frequency: Not Checking    Fasting glucose:     Post prandial:  Diabetic Education: Not Completed Family history of diabetes: yes -- mother and brother, brother passed from diabetes due to dialysis   Relevant past medical, surgical, family and social history reviewed and updated as indicated. Interim medical history since our last visit reviewed. Allergies and medications reviewed and updated.  Review of Systems  Constitutional:  Positive for fatigue.  Eyes:  Positive for visual disturbance (on Saturday).  Respiratory:  Negative for cough, chest tightness, shortness of breath and wheezing.   Cardiovascular:  Negative for chest pain, palpitations and leg swelling.  Endocrine: Positive for polydipsia. Negative for polyphagia and polyuria.  Neurological:  Positive for dizziness (on Saturday). Negative for tremors, facial asymmetry, speech difficulty, weakness, light-headedness, numbness and headaches.  Psychiatric/Behavioral: Negative.     Per HPI unless specifically indicated above     Objective:    BP (!) 158/102 (BP Location: Left Arm, Patient Position: Sitting, Cuff Size: Normal)   Pulse 78   Temp (!) 97.4 F (36.3 C) (Oral)   Ht 5' 7.2" (1.707 m)   Wt 185 lb 3.2 oz (84 kg)   SpO2 99%   BMI 28.83 kg/m   Wt Readings from Last 3  Encounters:  07/08/23 185 lb 3.2 oz (84 kg)  05/27/23 184 lb 6.4 oz (83.6 kg)  04/30/23 185 lb 9.6 oz (84.2 kg)    Physical Exam Vitals and nursing note reviewed.  Constitutional:      General: He is awake. He is not in acute distress.    Appearance: He is well-developed and well-groomed. He is not ill-appearing or toxic-appearing.  HENT:     Head: Normocephalic.     Right Ear: Hearing and external ear normal.     Left Ear: Hearing and external ear normal.  Eyes:     General: Lids are normal.      Extraocular Movements: Extraocular movements intact.     Conjunctiva/sclera: Conjunctivae normal.     Visual Fields: Right eye visual fields normal and left eye visual fields normal.  Neck:     Thyroid: No thyromegaly.     Vascular: No carotid bruit.  Cardiovascular:     Rate and Rhythm: Normal rate and regular rhythm.     Heart sounds: Normal heart sounds. No murmur heard.    No gallop.  Pulmonary:     Effort: No accessory muscle usage or respiratory distress.     Breath sounds: Normal breath sounds.  Abdominal:     General: Bowel sounds are normal. There is no distension.     Palpations: Abdomen is soft.     Tenderness: There is no abdominal tenderness.  Musculoskeletal:     Cervical back: Full passive range of motion without pain.     Right lower leg: No edema.     Left lower leg: No edema.  Lymphadenopathy:     Cervical: No cervical adenopathy.  Skin:    General: Skin is warm.     Capillary Refill: Capillary refill takes less than 2 seconds.  Neurological:     Mental Status: He is alert and oriented to person, place, and time.     Cranial Nerves: Cranial nerves 2-12 are intact.     Motor: Motor function is intact.     Coordination: Coordination is intact.     Gait: Gait is intact.     Deep Tendon Reflexes: Reflexes are normal and symmetric. Babinski sign absent on the right side. Babinski sign absent on the left side.     Reflex Scores:      Brachioradialis reflexes are 2+ on the right side and 2+ on the left side.      Patellar reflexes are 2+ on the right side and 2+ on the left side. Psychiatric:        Attention and Perception: Attention normal.        Mood and Affect: Mood normal.        Speech: Speech normal.        Behavior: Behavior normal. Behavior is cooperative.        Thought Content: Thought content normal.     Results for orders placed or performed in visit on 12/01/22  Comp Met (CMET)   Collection Time: 12/01/22  3:08 PM  Result Value Ref Range    Glucose 99 70 - 99 mg/dL   BUN 9 6 - 24 mg/dL   Creatinine, Ser 1.61 0.76 - 1.27 mg/dL   eGFR 84 >09 UE/AVW/0.98   BUN/Creatinine Ratio 9 9 - 20   Sodium 141 134 - 144 mmol/L   Potassium 3.8 3.5 - 5.2 mmol/L   Chloride 100 96 - 106 mmol/L   CO2 27 20 - 29 mmol/L   Calcium  9.8 8.7 - 10.2 mg/dL   Total Protein 7.4 6.0 - 8.5 g/dL   Albumin 4.8 3.8 - 4.9 g/dL   Globulin, Total 2.6 1.5 - 4.5 g/dL   Bilirubin Total 0.5 0.0 - 1.2 mg/dL   Alkaline Phosphatase 85 44 - 121 IU/L   AST 26 0 - 40 IU/L   ALT 33 0 - 44 IU/L  HgB A1c   Collection Time: 12/01/22  3:08 PM  Result Value Ref Range   Hgb A1c MFr Bld 6.7 (H) 4.8 - 5.6 %   Est. average glucose Bld gHb Est-mCnc 146 mg/dL  Lipid Profile   Collection Time: 12/01/22  3:08 PM  Result Value Ref Range   Cholesterol, Total 228 (H) 100 - 199 mg/dL   Triglycerides 161 (H) 0 - 149 mg/dL   HDL 41 >09 mg/dL   VLDL Cholesterol Cal 41 (H) 5 - 40 mg/dL   LDL Chol Calc (NIH) 604 (H) 0 - 99 mg/dL   Chol/HDL Ratio 5.6 (H) 0.0 - 5.0 ratio      Assessment & Plan:   Problem List Items Addressed This Visit       Cardiovascular and Mediastinum   Primary hypertension   Chronic, uncontrolled.  ?whether some of recent symptoms related to BP or blood sugar levels -- polydipsia and one episode of dizziness and blurred vision.  No other symptoms concerning for stroke and neuro exam reassuring.  We discussed stroke symptoms and he is aware to go to ER if they present.  At this time will increase Amlodipine to 5 MG daily and add on Hydralazine 10 MG to take as needed for BP >150/90 at home.  Reduce salt in diet and ensure plenty of water intake.  Recommend complete cessation of smoking.  Monitor BP twice a day and document, provided him a form to write on.  We also discussed Medicaid application and provided him link to this as based on income he should be approved.  Check BMP and A1c today. - If ongoing elevations then will need to increase Amlodipine to 10 MG  and may need to change Hydralazine to BID dosing without PRN.        Relevant Medications   amLODipine (NORVASC) 5 MG tablet   hydrALAZINE (APRESOLINE) 10 MG tablet   Other Relevant Orders   Basic Metabolic Panel (BMET)     Other   Prediabetes - Primary   Concern that he may be trending up on A1c based on symptoms recently.  Has strong family history.  Will recheck A1c today and initiate medication as needed based on result.  Discussed at length with patient today.      Relevant Orders   HgB A1c     Follow up plan: Return in about 2 weeks (around 07/22/2023) for HTN -- increased Amlodipine.

## 2023-07-08 NOTE — Assessment & Plan Note (Addendum)
Chronic, uncontrolled.  ?whether some of recent symptoms related to BP or blood sugar levels -- polydipsia and one episode of dizziness and blurred vision.  No other symptoms concerning for stroke and neuro exam reassuring.  We discussed stroke symptoms and he is aware to go to ER if they present.  At this time will increase Amlodipine to 5 MG daily and add on Hydralazine 10 MG to take as needed for BP >150/90 at home.  Reduce salt in diet and ensure plenty of water intake.  Recommend complete cessation of smoking.  Monitor BP twice a day and document, provided him a form to write on.  We also discussed Medicaid application and provided him link to this as based on income he should be approved.  Check BMP and A1c today. - If ongoing elevations then will need to increase Amlodipine to 10 MG and may need to change Hydralazine to BID dosing without PRN.

## 2023-07-08 NOTE — Telephone Encounter (Signed)
    Chief Complaint: Elevated BP  x "a few days. I felt sick like flu or something." No symptoms today. BP 190/120. Symptoms: Above Frequency: Few days Pertinent Negatives: Patient denies chest pain Disposition: [] ED /[] Urgent Care (no appt availability in office) / [x] Appointment(In office/virtual)/ []  [facility]/ [] Home Care/ [] Refused Recommended Disposition /[] [facility]/ []  Follow-up with PCP Additional Notes: Agrees with appointment, will go to ED for worsening of symptoms.  Reason for Disposition  [1] Systolic BP  >= 200 OR Diastolic >= 120 AND [2] having NO cardiac or neurologic symptoms  Answer Assessment - Initial Assessment Questions 1. BLOOD PRESSURE: "What is the blood pressure?" "Did you take at least two measurements 5 minutes apart?"     190/120 2. ONSET: "When did you take your blood pressure?"     Few days 3. HOW: "How did you take your blood pressure?" (e.g., automatic home BP monitor, visiting nurse)     Home cuff 4. HISTORY: "Do you have a history of high blood pressure?"     Yes 5. MEDICINES: "Are you taking any medicines for blood pressure?" "Have you missed any doses recently?"     Yes 6. OTHER SYMPTOMS: "Do you have any symptoms?" (e.g., blurred vision, chest pain, difficulty breathing, headache, weakness)     Blurred vision 7. PREGNANCY: "Is there any chance you are pregnant?" "When was your last menstrual period?"     N/a  Protocols used: Blood Pressure - High-A-AH

## 2023-07-08 NOTE — Patient Instructions (Addendum)
[email or web address]  Increased Amlodipine to 5 MG and added on Hydralazine to take as needed for BP >150/90  Heart-Healthy Eating Plan Many factors influence your heart health, including eating and exercise habits. Heart health is also called coronary health. Coronary risk increases with abnormal blood fat (lipid) levels. A heart-healthy eating plan includes limiting unhealthy fats, increasing healthy fats, limiting salt (sodium) intake, and making other diet and lifestyle changes. What is my plan? Your health care provider may recommend that: You limit your fat intake to _________% or less of your total calories each day. You limit your saturated fat intake to _________% or less of your total calories each day. You limit the amount of cholesterol in your diet to less than _________ mg per day. You limit the amount of sodium in your diet to less than _________ mg per day. What are tips for following this plan? Cooking Cook foods using methods other than frying. Baking, boiling, grilling, and broiling are all good options. Other ways to reduce fat include: Removing the skin from poultry. Removing all visible fats from meats. Steaming vegetables in water or broth. Meal planning  At meals, imagine dividing your plate into fourths: Fill one-half of your plate with vegetables and green salads. Fill one-fourth of your plate with whole grains. Fill one-fourth of your plate with lean protein foods. Eat 2-4 cups of vegetables per day. One cup of vegetables equals 1 cup (91 g) broccoli or cauliflower florets, 2 medium carrots, 1 large bell pepper, 1 large sweet potato, 1 large tomato, 1 medium white potato, 2 cups (150 g) raw leafy greens. Eat 1-2 cups of fruit per day. One cup of fruit equals 1 small apple, 1 large banana, 1 cup (237 g) mixed fruit, 1 large orange,  cup (82 g) dried fruit, 1 cup (240 mL) 100% fruit juice. Eat more foods that contain soluble fiber. Examples  include apples, broccoli, carrots, beans, peas, and barley. Aim to get 25-30 g of fiber per day. Increase your consumption of legumes, nuts, and seeds to 4-5 servings per week. One serving of dried beans or legumes equals  cup (90 g) cooked, 1 serving of nuts is  oz (12 almonds, 24 pistachios, or 7 walnut halves), and 1 serving of seeds equals  oz (8 g). Fats Choose healthy fats more often. Choose monounsaturated and polyunsaturated fats, such as olive and canola oils, avocado oil, flaxseeds, walnuts, almonds, and seeds. Eat more omega-3 fats. Choose salmon, mackerel, sardines, tuna, flaxseed oil, and ground flaxseeds. Aim to eat fish at least 2 times each week. Check food labels carefully to identify foods with trans fats or high amounts of saturated fat. Limit saturated fats. These are found in animal products, such as meats, butter, and cream. Plant sources of saturated fats include palm oil, palm kernel oil, and coconut oil. Avoid foods with partially hydrogenated oils in them. These contain trans fats. Examples are stick margarine, some tub margarines, cookies, crackers, and other baked goods. Avoid fried foods. General information Eat more home-cooked food and less restaurant, buffet, and fast food. Limit or avoid alcohol. Limit foods that are high in added sugar and simple starches such as foods made using white refined flour (white breads, pastries, sweets). Lose weight if you are overweight. Losing just 5-10% of your body weight can help your overall health and prevent diseases such as diabetes and heart disease. Monitor your sodium intake, especially if you have high blood pressure. Talk with your health care provider about your sodium  intake. Try to incorporate more vegetarian meals weekly. What foods should I eat? Fruits All fresh, canned (in natural juice), or frozen fruits. Vegetables Fresh or frozen vegetables (raw, steamed, roasted, or grilled). Green salads. Grains Most  grains. Choose whole wheat and whole grains most of the time. Rice and pasta, including brown rice and pastas made with whole wheat. Meats and other proteins Lean, well-trimmed beef, veal, pork, and lamb. Chicken and [place] without skin. All fish and shellfish. Wild duck, rabbit, pheasant, and venison. Egg whites or low-cholesterol egg substitutes. Dried beans, peas, lentils, and tofu. Seeds and most nuts. Dairy Low-fat or nonfat cheeses, including ricotta and mozzarella. Skim or 1% milk (liquid, powdered, or evaporated). Buttermilk made with low-fat milk. Nonfat or low-fat yogurt. Fats and oils Non-hydrogenated (trans-free) margarines. Vegetable oils, including soybean, sesame, sunflower, olive, avocado, peanut, safflower, corn, canola, and cottonseed. Salad dressings or mayonnaise made with a vegetable oil. Beverages Water (mineral or sparkling). Coffee and tea. Unsweetened ice tea. Diet beverages. Sweets and desserts Sherbet, gelatin, and fruit ice. Small amounts of dark chocolate. Limit all sweets and desserts. Seasonings and condiments All seasonings and condiments. The items listed above may not be a complete list of foods and beverages you can eat. Contact a dietitian for more options. What foods should I avoid? Fruits Canned fruit in heavy syrup. Fruit in cream or butter sauce. Fried fruit. Limit coconut. Vegetables Vegetables cooked in cheese, cream, or butter sauce. Fried vegetables. Grains Breads made with saturated or trans fats, oils, or whole milk. Croissants. Sweet rolls. Donuts. High-fat crackers, such as cheese crackers and chips. Meats and other proteins Fatty meats, such as hot dogs, ribs, sausage, bacon, rib-eye roast or steak. High-fat deli meats, such as salami and bologna. Caviar. Domestic duck and goose. Organ meats, such as liver. Dairy Cream, sour cream, cream cheese, and creamed cottage cheese. Whole-milk cheeses. Whole or 2% milk (liquid, evaporated, or  condensed). Whole buttermilk. Cream sauce or high-fat cheese sauce. Whole-milk yogurt. Fats and oils Meat fat, or shortening. Cocoa butter, hydrogenated oils, palm oil, coconut oil, palm kernel oil. Solid fats and shortenings, including bacon fat, salt pork, lard, and butter. Nondairy cream substitutes. Salad dressings with cheese or sour cream. Beverages Regular sodas and any drinks with added sugar. Sweets and desserts Frosting. Pudding. Cookies. Cakes. Pies. Milk chocolate or white chocolate. Buttered syrups. Full-fat ice cream or ice cream drinks. The items listed above may not be a complete list of foods and beverages to avoid. Contact a dietitian for more information. Summary Heart-healthy meal planning includes limiting unhealthy fats, increasing healthy fats, limiting salt (sodium) intake and making other diet and lifestyle changes. Lose weight if you are overweight. Losing just 5-10% of your body weight can help your overall health and prevent diseases such as diabetes and heart disease. Focus on eating a balance of foods, including fruits and vegetables, low-fat or nonfat dairy, lean protein, nuts and legumes, whole grains, and heart-healthy oils and fats. This information is not intended to replace advice given to you by your health care provider. Make sure you discuss any questions you have with your health care provider. Document Revised: 07/08/2021 Document Reviewed: 07/08/2021 Elsevier Patient Education  2024 [employer].

## 2023-07-09 LAB — BASIC METABOLIC PANEL
BUN/Creatinine Ratio: 11 (ref 10–24)
BUN: 11 mg/dL (ref 8–27)
CO2: 26 mmol/L (ref 20–29)
Calcium: 9.4 mg/dL (ref 8.6–10.2)
Chloride: 102 mmol/L (ref 96–106)
Creatinine, Ser: 1.03 mg/dL (ref 0.76–1.27)
Glucose: 110 mg/dL — ABNORMAL HIGH (ref 70–99)
Potassium: 4.1 mmol/L (ref 3.5–5.2)
Sodium: 141 mmol/L (ref 134–144)
eGFR: 83 mL/min/{1.73_m2} (ref >59)

## 2023-07-09 LAB — HEMOGLOBIN A1C
Est. average glucose Bld gHb Est-mCnc: 146 mg/dL
Hgb A1c MFr Bld: 6.7 % — ABNORMAL HIGH (ref 4.8–5.6)

## 2023-07-09 MED ORDER — METFORMIN HCL 500 MG PO TABS: 500.0000 mg | ORAL_TABLET | Freq: Two times a day (BID) | ORAL | 3 refills | Status: DC

## 2023-07-09 NOTE — Addendum Note (Signed)
Addended by: Marjie Skiff on: 07/09/2023 04:54 PM   Modules accepted: Orders

## 2023-07-09 NOTE — Progress Notes (Signed)
Good morning, please let Luke Griffin know his labs have returned: - A1c, testing for diabetes, remains 6.7% comparable to last check.  This does continue to place him above 6.5% which is considered diabetes.  We have some options, we could start a low dose of Metformin 500 MG once to twice a day, this helps your body process the insulin your do make and utilize it.  It can cause some nausea or diarrhea, often more at higher doses.  The other option is focusing heavily on healthy diet changes and regular exercise for now and if it elevates further then will need to start medication.  Suspect you do have diabetes based on this lab and past, overtime this can worsen dependent on age and overall health. - Kidney function and electrolytes are overall stable. How are you tolerating the Amlodipine increase?  How is blood pressure?  Have you had to take Hydralazine?  Let me know:) Keep being stellar!!  Thank you for allowing me to participate in your care.  I appreciate you. Kindest regards, Zonnique Norkus

## 2023-07-22 ENCOUNTER — Ambulatory Visit: Payer: Self-pay | Admitting: Nurse Practitioner

## 2023-07-31 ENCOUNTER — Ambulatory Visit: Payer: Self-pay | Admitting: Nurse Practitioner

## 2023-08-04 ENCOUNTER — Ambulatory Visit: Payer: Self-pay | Admitting: Nurse Practitioner

## 2023-08-04 NOTE — Progress Notes (Deleted)
   There were no vitals taken for this visit.   Subjective:    Patient ID: Luke Griffin, male    DOB: 1961/05/16, 63 y.o.   MRN: [id number]  HPI: Luke Griffin is a 63 y.o. male  No chief complaint on file.  HYPERTENSION / HYPERLIPIDEMIA Satisfied with current treatment? {Blank single:19197::"yes","no"} Duration of hypertension: {Blank single:19197::"chronic","months","years"} BP monitoring frequency: {Blank single:19197::"not checking","rarely","daily","weekly","monthly","a few times a day","a few times a week","a few times a month"} BP range:  BP medication side effects: {Blank single:19197::"yes","no"} Past BP meds: {Blank multiple:19196::"none","amlodipine","amlodipine/benazepril","atenolol","benazepril","benazepril/HCTZ","bisoprolol (bystolic)","carvedilol","chlorthalidone","clonidine","diltiazem","exforge HCT","HCTZ","irbesartan (avapro)","labetalol","lisinopril","lisinopril-HCTZ","losartan (cozaar)","methyldopa","nifedipine","olmesartan (benicar)","olmesartan-HCTZ","quinapril","ramipril","spironalactone","tekturna","valsartan","valsartan-HCTZ","verapamil"} Duration of hyperlipidemia: {Blank single:19197::"chronic","months","years"} Cholesterol medication side effects: {Blank single:19197::"yes","no"} Cholesterol supplements: {Blank multiple:19196::"none","fish oil","niacin","red yeast rice"} Past cholesterol medications: {Blank multiple:19196::"none","atorvastain (lipitor)","lovastatin (mevacor)","pravastatin (pravachol)","rosuvastatin (crestor)","simvastatin (zocor)","vytorin","fenofibrate (tricor)","gemfibrozil","ezetimide (zetia)","niaspan","lovaza"} Medication compliance: {Blank single:19197::"excellent compliance","good compliance","fair compliance","poor compliance"} Aspirin: {Blank single:19197::"yes","no"} Recent stressors: {Blank single:19197::"yes","no"} Recurrent headaches: {Blank single:19197::"yes","no"} Visual changes: {Blank  single:19197::"yes","no"} Palpitations: {Blank single:19197::"yes","no"} Dyspnea: {Blank single:19197::"yes","no"} Chest pain: {Blank single:19197::"yes","no"} Lower extremity edema: {Blank single:19197::"yes","no"} Dizzy/lightheaded: {Blank single:19197::"yes","no"}   Relevant past medical, surgical, family and social history reviewed and updated as indicated. Interim medical history since our last visit reviewed. Allergies and medications reviewed and updated.  Review of Systems  Per HPI unless specifically indicated above     Objective:    There were no vitals taken for this visit.  Wt Readings from Last 3 Encounters:  07/08/23 185 lb 3.2 oz (84 kg)  05/27/23 184 lb 6.4 oz (83.6 kg)  04/30/23 185 lb 9.6 oz (84.2 kg)    Physical Exam  Results for orders placed or performed in visit on 07/08/23  Basic Metabolic Panel (BMET)   Collection Time: 07/08/23  2:16 PM  Result Value Ref Range   Glucose 110 (H) 70 - 99 mg/dL   BUN 11 8 - 27 mg/dL   Creatinine, Ser 2.84 0.76 - 1.27 mg/dL   eGFR 83 >13 KG/MWN/0.27   BUN/Creatinine Ratio 11 10 - 24   Sodium 141 134 - 144 mmol/L   Potassium 4.1 3.5 - 5.2 mmol/L   Chloride 102 96 - 106 mmol/L   CO2 26 20 - 29 mmol/L   Calcium 9.4 8.6 - 10.2 mg/dL  HgB O5D   Collection Time: 07/08/23  2:16 PM  Result Value Ref Range   Hgb A1c MFr Bld 6.7 (H) 4.8 - 5.6 %   Est. average glucose Bld gHb Est-mCnc 146 mg/dL      Assessment & Plan:   Problem List Items Addressed This Visit       Cardiovascular and Mediastinum   Primary hypertension - Primary     Other   Hyperlipidemia   Prediabetes     Follow up plan: No follow-ups on file.

## 2023-09-03 ENCOUNTER — Encounter: Payer: Self-pay | Admitting: Nurse Practitioner

## 2023-09-03 ENCOUNTER — Ambulatory Visit (INDEPENDENT_AMBULATORY_CARE_PROVIDER_SITE_OTHER): Payer: Self-pay | Admitting: Nurse Practitioner

## 2023-09-03 VITALS — BP 136/85 | HR 73 | Wt 180.6 lb

## 2023-09-03 DIAGNOSIS — I10 Essential (primary) hypertension: Secondary | ICD-10-CM

## 2023-09-03 MED ORDER — HYDROCHLOROTHIAZIDE 12.5 MG PO TABS: 12.5000 mg | ORAL_TABLET | Freq: Every day | ORAL | 1 refills | Status: DC

## 2023-09-03 NOTE — Assessment & Plan Note (Signed)
 Chronic.  Improved.  Recommend hydralazine BID.  Continue Amlodipine and Lisinopril.  HCTZ cut in half to 12.5mg .  Patient tolerates that dose better. Continue to check blood pressures at home.  Bring log to next visit.  Follow up in 2 months.  Call sooner if concerns arise.

## 2023-09-03 NOTE — Progress Notes (Signed)
 BP 136/85 (BP Location: Left Arm, Patient Position: Sitting, Cuff Size: Large)   Pulse 73   Wt 180 lb 9.6 oz (81.9 kg)   SpO2 96%   BMI 28.12 kg/m    Subjective:    Patient ID: Luke Griffin, male    DOB: 1960-12-01, 63 y.o.   MRN: [id number]  HPI: Luke Griffin is a 63 y.o. male  Chief Complaint  Patient presents with   Hypertension   Medication Management    Amlodipine and lisinopril when taken at night raise bp in the morning, Waking up with headaches and ringing of ears, hydralazine works better per patient Hydrochlorothiazide causes dehydration, states it maybe too strong    HYPERTENSION / HYPERLIPIDEMIA Patient states he is taking hydralazine daily, lisinopril and amlodipine at bedtime.  He is waking up and his blood pressure is high.  He wakes up with a headache and ears are ringing.  Then he takes hydralazine and in about an hour his symptoms have resolved.  HCTZ causes him a lot of fatigue.   Satisfied with current treatment? no Duration of hypertension: years BP monitoring frequency: daily BP range: 117-180-80 BP medication side effects: no Past BP meds:  hydrochlorothiazide, hydralazine, amlodipine, and lisinopril Medication compliance: excellent compliance Aspirin: no Recent stressors: no Recurrent headaches: headache Visual changes: no Palpitations: no Dyspnea: no Chest pain: no Lower extremity edema: no Dizzy/lightheaded: no   Relevant past medical, surgical, family and social history reviewed and updated as indicated. Interim medical history since our last visit reviewed. Allergies and medications reviewed and updated.  Review of Systems  Eyes:  Negative for visual disturbance.  Respiratory:  Negative for shortness of breath.   Cardiovascular:  Negative for chest pain and leg swelling.  Neurological:  Positive for headaches. Negative for light-headedness.    Per HPI unless specifically indicated above     Objective:    BP 136/85 (BP  Location: Left Arm, Patient Position: Sitting, Cuff Size: Large)   Pulse 73   Wt 180 lb 9.6 oz (81.9 kg)   SpO2 96%   BMI 28.12 kg/m   Wt Readings from Last 3 Encounters:  09/03/23 180 lb 9.6 oz (81.9 kg)  07/08/23 185 lb 3.2 oz (84 kg)  05/27/23 184 lb 6.4 oz (83.6 kg)    Physical Exam Vitals and nursing note reviewed.  Constitutional:      General: He is not in acute distress.    Appearance: Normal appearance. He is not ill-appearing, toxic-appearing or diaphoretic.  HENT:     Head: Normocephalic.     Right Ear: External ear normal.     Left Ear: External ear normal.     Nose: Nose normal. No congestion or rhinorrhea.     Mouth/Throat:     Mouth: Mucous membranes are moist.  Eyes:     General:        Right eye: No discharge.        Left eye: No discharge.     Extraocular Movements: Extraocular movements intact.     Conjunctiva/sclera: Conjunctivae normal.     Pupils: Pupils are equal, round, and reactive to light.  Cardiovascular:     Rate and Rhythm: Normal rate and regular rhythm.     Heart sounds: No murmur heard. Pulmonary:     Effort: Pulmonary effort is normal. No respiratory distress.     Breath sounds: Wheezing present. No rhonchi or rales.  Abdominal:     General: Abdomen is flat. Bowel sounds are normal.  Musculoskeletal:     Cervical back: Normal range of motion and neck supple.  Skin:    General: Skin is warm and dry.     Capillary Refill: Capillary refill takes less than 2 seconds.  Neurological:     General: No focal deficit present.     Mental Status: He is alert and oriented to person, place, and time.  Psychiatric:        Mood and Affect: Mood normal.        Behavior: Behavior normal.        Thought Content: Thought content normal.        Judgment: Judgment normal.     Results for orders placed or performed in visit on 07/08/23  Basic Metabolic Panel (BMET)   Collection Time: 07/08/23  2:16 PM  Result Value Ref Range   Glucose 110 (H) 70 -  99 mg/dL   BUN 11 8 - 27 mg/dL   Creatinine, Ser 4.40 0.76 - 1.27 mg/dL   eGFR 83 >10 UV/OZD/6.64   BUN/Creatinine Ratio 11 10 - 24   Sodium 141 134 - 144 mmol/L   Potassium 4.1 3.5 - 5.2 mmol/L   Chloride 102 96 - 106 mmol/L   CO2 26 20 - 29 mmol/L   Calcium 9.4 8.6 - 10.2 mg/dL  HgB Q0H   Collection Time: 07/08/23  2:16 PM  Result Value Ref Range   Hgb A1c MFr Bld 6.7 (H) 4.8 - 5.6 %   Est. average glucose Bld gHb Est-mCnc 146 mg/dL      Assessment & Plan:   Problem List Items Addressed This Visit       Cardiovascular and Mediastinum   Primary hypertension - Primary   Chronic.  Improved.  Recommend hydralazine BID.  Continue Amlodipine and Lisinopril.  HCTZ cut in half to 12.5mg .  Patient tolerates that dose better. Continue to check blood pressures at home.  Bring log to next visit.  Follow up in 2 months.  Call sooner if concerns arise.       Relevant Medications   hydrochlorothiazide (HYDRODIURIL) 12.5 MG tablet      Follow up plan: Return in about 2 months (around 11/03/2023) for BP Check.

## 2023-09-14 ENCOUNTER — Other Ambulatory Visit: Payer: Self-pay | Admitting: Nurse Practitioner

## 2023-09-16 ENCOUNTER — Other Ambulatory Visit: Payer: Self-pay | Admitting: Nurse Practitioner

## 2023-09-16 NOTE — Telephone Encounter (Signed)
 Requested medication (s) are due for refill today: no  Requested medication (s) are on the active medication list: no  Last refill:  07/02/23  Future visit scheduled: yes  Notes to clinic: Med not delegated to NT to RF or refuse- duplicate request: 07/08/23 discontinued by J. Cannady NP   Requested Prescriptions  Pending Prescriptions Disp Refills   lisinopril (ZESTRIL) 40 MG tablet [Pharmacy Med Name: Lisinopril 40 MG Oral Tablet] 90 tablet 0    Sig: Take 1 tablet by mouth once daily     There is no refill protocol information for this order

## 2023-09-16 NOTE — Telephone Encounter (Signed)
 Requested Prescriptions  Pending Prescriptions Disp Refills   amLODipine (NORVASC) 5 MG tablet [Pharmacy Med Name: amLODIPine Besylate 5 MG Oral Tablet] 60 tablet 0    Sig: Take 1 tablet by mouth once daily     Cardiovascular: Calcium Channel Blockers 2 Failed - 09/16/2023  2:00 PM      Failed - Valid encounter within last 6 months    Recent Outpatient Visits           1 week ago Primary hypertension   [facility] Larae Grooms, NP              Passed - Last BP in normal range    BP Readings from Last 1 Encounters:  09/03/23 136/85         Passed - Last Heart Rate in normal range    Pulse Readings from Last 1 Encounters:  09/03/23 73

## 2023-09-16 NOTE — Telephone Encounter (Signed)
 Copied from CRM [id number]. Topic: Clinical - Medication Refill >> Sep 16, 2023 11:40 AM Bobbye Morton wrote: Most Recent Primary Care Visit:  Provider: Larae Grooms  Department: [facility]  Visit Type: OFFICE VISIT  Date: 09/03/2023  Medication:hydrALAZINE (APRESOLINE) 10 MG tablet  amLODipine (NORVASC) 5 MG tablet lisinopril (ZESTRIL) 40 MG tablet   Has the patient contacted their pharmacy? Yes (Agent: If no, request that the patient contact the pharmacy for the refill. If patient does not wish to contact the pharmacy document the reason why and proceed with request.) (Agent: If yes, when and what did the pharmacy advise?)  Is this the correct pharmacy for this prescription? Yes If no, delete pharmacy and type the correct one.  This is the patient's preferred pharmacy:  [facility] [address] ([address]), [place] - [address] [place]) [place] [address] Phone: [phone] Fax: [phone]   Has the prescription been filled recently? No  Is the patient out of the medication? Yes  Has the patient been seen for an appointment in the last year OR does the patient have an upcoming appointment? Yes  Can we respond through MyChart? No  Agent: Please be advised that Rx refills may take up to 3 business days. We ask that you follow-up with your pharmacy.

## 2023-09-16 NOTE — Telephone Encounter (Unsigned)
 Copied from CRM [id number]. Topic: Clinical - Medication Refill >> Sep 16, 2023 11:40 AM Bobbye Morton wrote: Most Recent Primary Care Visit:  Provider: Larae Grooms  Department: [facility]  Visit Type: OFFICE VISIT  Date: 09/03/2023  Medication:hydrALAZINE (APRESOLINE) 10 MG tablet  amLODipine (NORVASC) 5 MG tablet lisinopril (ZESTRIL) 40 MG tablet   Has the patient contacted their pharmacy? Yes (Agent: If no, request that the patient contact the pharmacy for the refill. If patient does not wish to contact the pharmacy document the reason why and proceed with request.) (Agent: If yes, when and what did the pharmacy advise?)  Is this the correct pharmacy for this prescription? Yes If no, delete pharmacy and type the correct one.  This is the patient's preferred pharmacy:  [facility] [address]), [place] - [address] [place]) [place] [address] Phone: [phone] Fax: [phone]   Has the prescription been filled recently? No  Is the patient out of the medication? Yes  Has the patient been seen for an appointment in the last year OR does the patient have an upcoming appointment? Yes  Can we respond through MyChart? No  Agent: Please be advised that Rx refills may take up to 3 business days. We ask that you follow-up with your pharmacy.

## 2023-09-17 MED ORDER — LISINOPRIL 40 MG PO TABS: 40.0000 mg | ORAL_TABLET | Freq: Every day | ORAL | 0 refills | Status: DC

## 2023-09-17 MED ORDER — HYDRALAZINE HCL 10 MG PO TABS: 10.0000 mg | ORAL_TABLET | Freq: Two times a day (BID) | ORAL | 0 refills | Status: DC | PRN

## 2023-09-17 NOTE — Telephone Encounter (Signed)
Medications sent to the pharmacy.

## 2023-10-01 ENCOUNTER — Telehealth: Payer: Self-pay | Admitting: Nurse Practitioner

## 2023-10-01 ENCOUNTER — Other Ambulatory Visit: Payer: Self-pay | Admitting: Nurse Practitioner

## 2023-10-01 NOTE — Telephone Encounter (Signed)
 Copied from CRM [id number]. Topic: Clinical - Medication Refill >> Oct 01, 2023  9:08 AM Lisa Rideau B wrote: Most Recent Primary Care Visit:  Provider: Aileen Alexanders  Department: [facility]  Visit Type: OFFICE VISIT  Date: 09/03/2023  Medication:  amLODipine (NORVASC) 5 MG tablet lisinopril (ZESTRIL) 40 MG tablet  This is the 3rd request, pt also says requested   Has the patient contacted their pharmacy? yes (Agent: If yes, when and what did the pharmacy advise?)contact pcp, shows sent 04/03 but pt was told they dont have it  Is this the correct pharmacy for this prescription? yes  This is the patient's preferred pharmacy:  [facility] [address]), [place] - [address] [place]) [place] [address] Phone: [phone] Fax: [phone]   Has the prescription been filled recently? no  Is the patient out of the medication? yes  Has the patient been seen for an appointment in the last year OR does the patient have an upcoming appointment? yes  Can we respond through MyChart? no  Agent: Please be advised that Rx refills may take up to 3 business days. We ask that you follow-up with your pharmacy.

## 2023-10-02 NOTE — Telephone Encounter (Signed)
 Call to pharmacy: Amlodipine  was picked up 09/21/23- with 60 day supply- so not due refill yet Lisinopril  shows patient picked up 4/17- so should not need for 3 months. Attempted to call patient with information and to check his status with medication- should be sufficient supply Requested Prescriptions  Pending Prescriptions Disp Refills   amLODipine  (NORVASC ) 5 MG tablet 60 tablet 0    Sig: Take 1 tablet (5 mg total) by mouth daily.     Cardiovascular: Calcium  Channel Blockers 2 Failed - 10/02/2023 10:13 AM      Failed - Valid encounter within last 6 months    Recent Outpatient Visits           4 weeks ago Primary hypertension   [facility] Melvin Pao, NP              Passed - Last BP in normal range    BP Readings from Last 1 Encounters:  09/03/23 136/85         Passed - Last Heart Rate in normal range    Pulse Readings from Last 1 Encounters:  09/03/23 73          lisinopril  (ZESTRIL ) 40 MG tablet 90 tablet 0    Sig: Take 1 tablet (40 mg total) by mouth daily.     Cardiovascular:  ACE Inhibitors Failed - 10/02/2023 10:13 AM      Failed - Valid encounter within last 6 months    Recent Outpatient Visits           4 weeks ago Primary hypertension   [facility] Melvin Pao, NP              Passed - Cr in normal range and within 180 days    Creatinine, Ser  Date Value Ref Range Status  07/08/2023 1.03 0.76 - 1.27 mg/dL Final         Passed - K in normal range and within 180 days    Potassium  Date Value Ref Range Status  07/08/2023 4.1 3.5 - 5.2 mmol/L Final         Passed - Patient is not pregnant      Passed - Last BP in normal range    BP Readings from Last 1 Encounters:  09/03/23 136/85

## 2023-10-14 IMAGING — CR DG CHEST 2V
1 series · 2 of 2 positions shown · non-contrast
Comparison: None

CLINICAL DATA: A 60-year-old male presents with chest pain in the
LEFT chest that started 3 days ago.

EXAM:
CHEST - 2 VIEW

[Series 1: dg chest 2 view · 0.14mm/px · 2 of 2 slices shown]
[im 1/2]
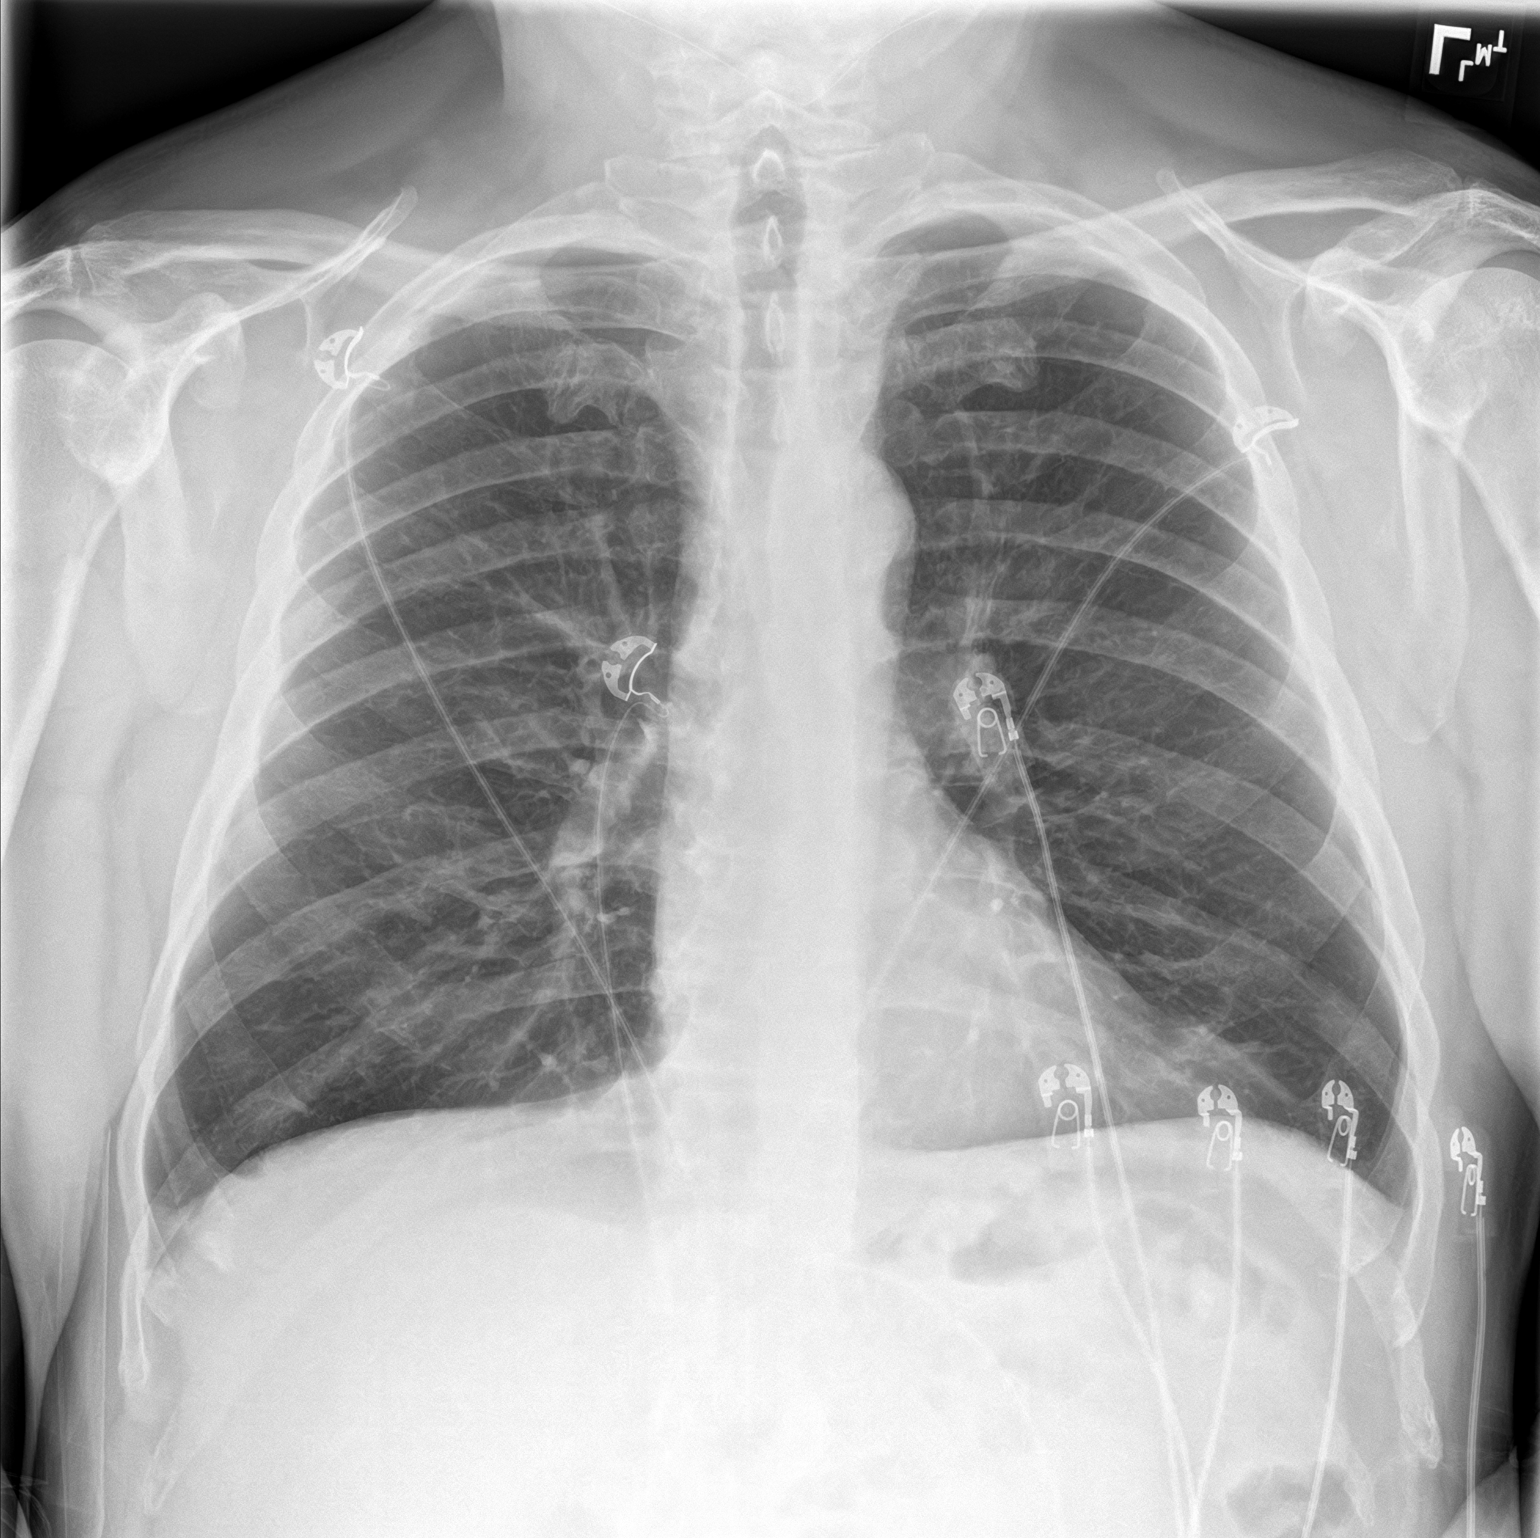
[im 2/2]
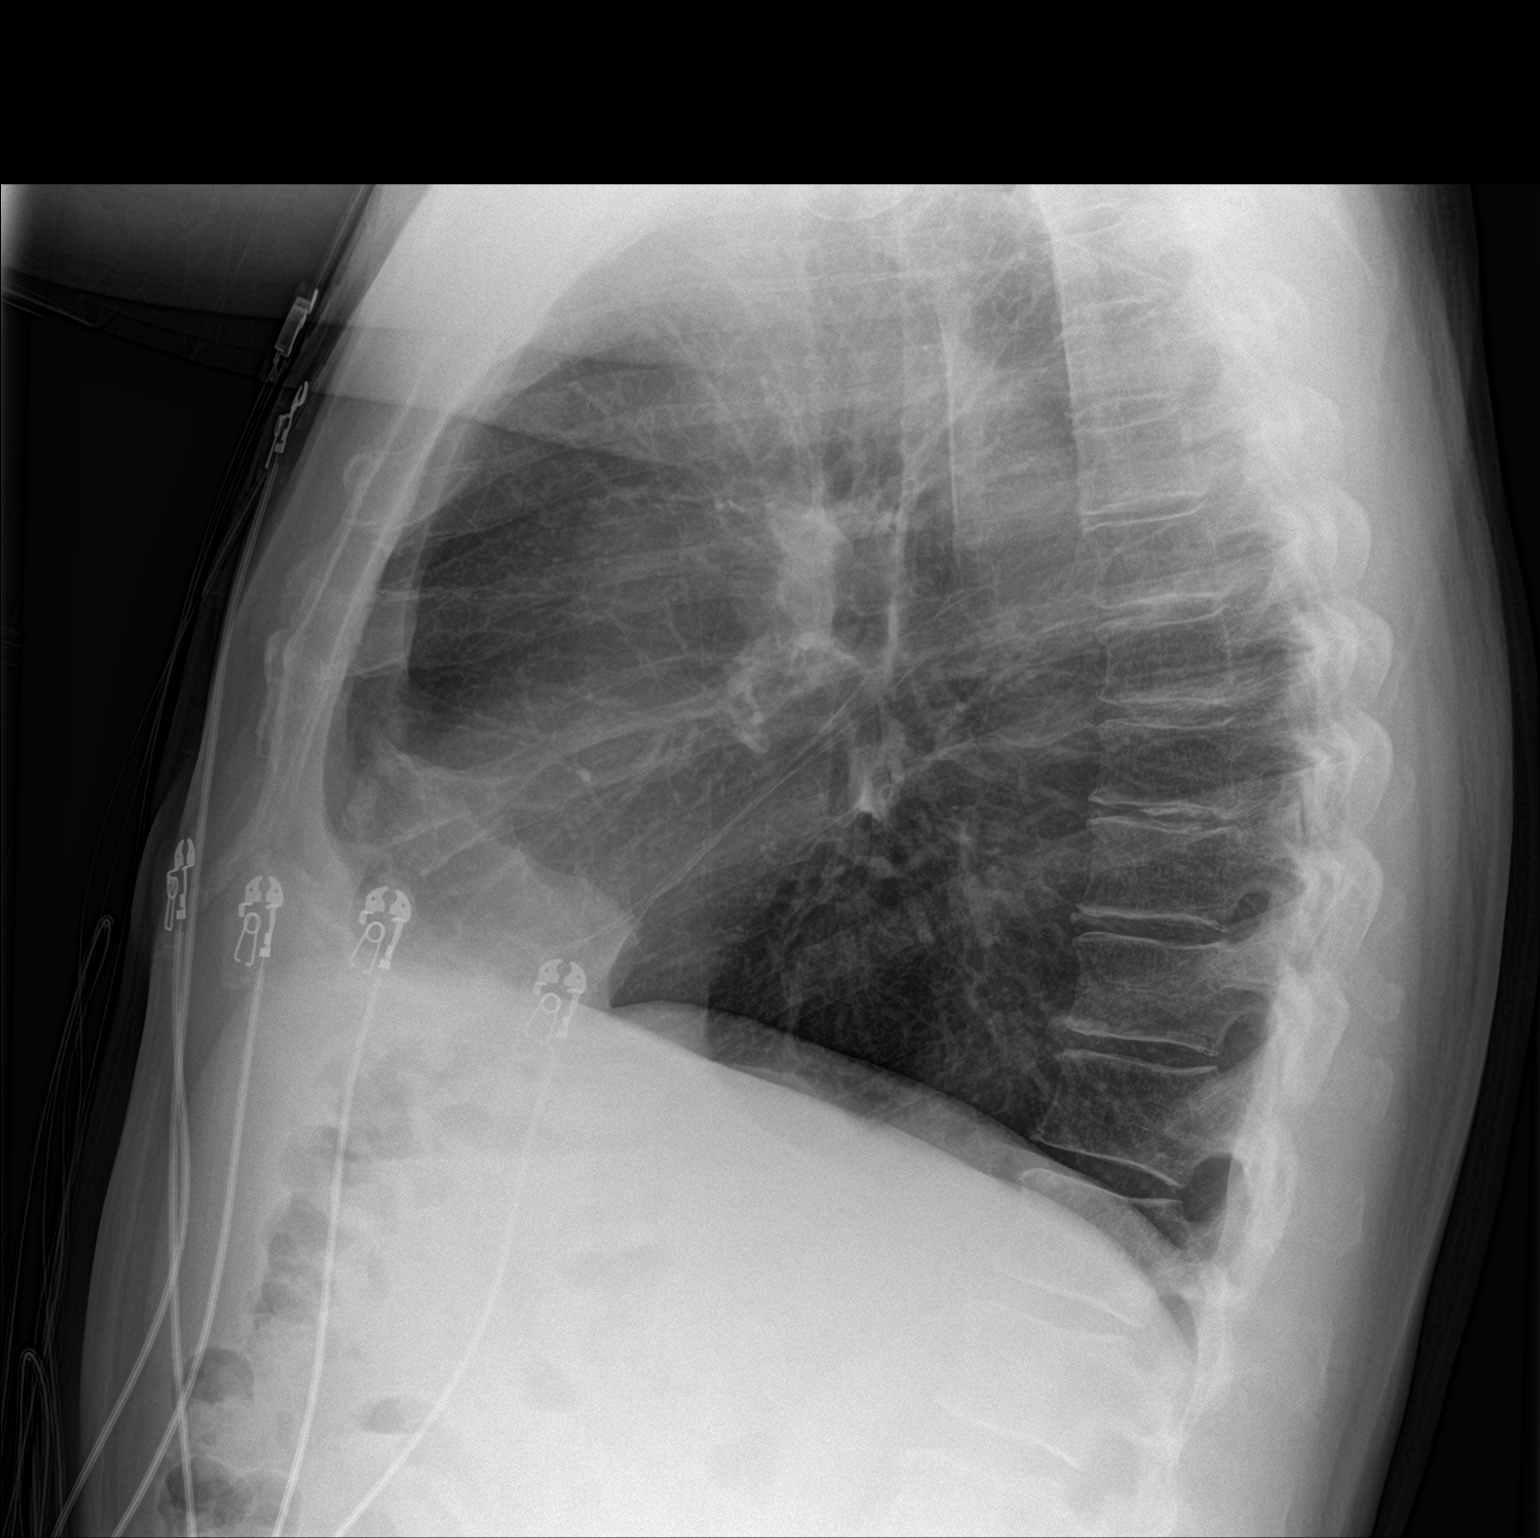

[2 of 2 positions shown; findings below may reference images not displayed]

FINDINGS: EKG leads project over the chest.

Trachea is midline.

Cardiomediastinal contours and hilar structures are normal. Lungs
are clear aside from minimal LEFT base atelectasis. No sign of
pleural effusion.

On limited assessment there is no acute skeletal process.
IMPRESSION: Minimal LEFT base atelectasis, otherwise no acute cardiopulmonary
disease.

## 2023-11-04 ENCOUNTER — Ambulatory Visit: Payer: Self-pay | Admitting: Nurse Practitioner

## 2023-11-19 ENCOUNTER — Other Ambulatory Visit: Payer: Self-pay | Admitting: Nurse Practitioner

## 2023-11-20 NOTE — Telephone Encounter (Signed)
 Too soon for refill for Lisinopril  40 mg.  Requested Prescriptions  Pending Prescriptions Disp Refills   albuterol  (VENTOLIN  HFA) 108 (90 Base) MCG/ACT inhaler [Pharmacy Med Name: Albuterol  Sulfate HFA 108 (90 Base) MCG/ACT Inhalation Aerosol Solution] 9 g 0    Sig: INHALE 2 PUFFS BY MOUTH EVERY 6 HOURS AS NEEDED FOR WHEEZING FOR SHORTNESS OF BREATH     Pulmonology:  Beta Agonists 2 Failed - 11/20/2023 12:03 PM      Failed - Valid encounter within last 12 months    Recent Outpatient Visits           2 months ago Primary hypertension   [facility] Aileen Alexanders, NP              Passed - Last BP in normal range    BP Readings from Last 1 Encounters:  09/03/23 136/85         Passed - Last Heart Rate in normal range    Pulse Readings from Last 1 Encounters:  09/03/23 73          lisinopril  (ZESTRIL ) 40 MG tablet [Pharmacy Med Name: Lisinopril  40 MG Oral Tablet] 90 tablet 0    Sig: Take 1 tablet by mouth once daily     Cardiovascular:  ACE Inhibitors Failed - 11/20/2023 12:03 PM      Failed - Valid encounter within last 6 months    Recent Outpatient Visits           2 months ago Primary hypertension   [facility] Aileen Alexanders, NP              Passed - Cr in normal range and within 180 days    Creatinine, Ser  Date Value Ref Range Status  07/08/2023 1.03 0.76 - 1.27 mg/dL Final         Passed - K in normal range and within 180 days    Potassium  Date Value Ref Range Status  07/08/2023 4.1 3.5 - 5.2 mmol/L Final         Passed - Patient is not pregnant      Passed - Last BP in normal range    BP Readings from Last 1 Encounters:  09/03/23 136/85

## 2023-11-23 ENCOUNTER — Encounter: Payer: Self-pay | Admitting: Pediatrics

## 2023-11-23 ENCOUNTER — Ambulatory Visit (INDEPENDENT_AMBULATORY_CARE_PROVIDER_SITE_OTHER): Payer: Self-pay | Admitting: Pediatrics

## 2023-11-23 VITALS — BP 154/84 | HR 68 | Temp 97.5°F | Wt 178.2 lb

## 2023-11-23 DIAGNOSIS — I10 Essential (primary) hypertension: Secondary | ICD-10-CM

## 2023-11-23 DIAGNOSIS — M79604 Pain in right leg: Secondary | ICD-10-CM

## 2023-11-23 MED ORDER — AMLODIPINE BESYLATE 10 MG PO TABS
5.0000 mg | ORAL_TABLET | Freq: Every day | ORAL | 3 refills | Status: DC
Start: 2023-11-23 — End: 2024-02-23

## 2023-11-23 MED ORDER — HYDRALAZINE HCL 10 MG PO TABS
10.0000 mg | ORAL_TABLET | Freq: Two times a day (BID) | ORAL | 0 refills | Status: DC | PRN
Start: 2023-11-23 — End: 2024-02-23

## 2023-11-23 MED ORDER — TRIAMCINOLONE ACETONIDE 40 MG/ML IJ SUSP
40.0000 mg | Freq: Once | INTRAMUSCULAR | Status: AC
  Administered 2023-11-23: 40 mg via INTRAMUSCULAR

## 2023-11-23 MED ORDER — LISINOPRIL 40 MG PO TABS: 40.0000 mg | ORAL_TABLET | Freq: Every day | ORAL | 3 refills | Status: AC

## 2023-11-23 NOTE — Patient Instructions (Signed)
 For blood pressure: Amlodipine  10mg  Lisinopril  40mg  Hydralazine  10mg  in the morning

## 2023-11-23 NOTE — Progress Notes (Signed)
 Office Visit  BP (!) 154/84   Pulse 68   Temp (!) 97.5 F (36.4 C) (Oral)   Wt 178 lb 3.2 oz (80.8 kg)   SpO2 96%   BMI 27.74 kg/m    Subjective:    Patient ID: Luke Griffin, male    DOB: 21-Dec-1960, 63 y.o.   MRN: [id number]  HPI: Luke Griffin is a 63 y.o. male  Chief Complaint  Patient presents with   Leg Pain    Right leg pain Last 4 days     Discussed the use of AI scribe software for clinical note transcription with the patient, who gave verbal consent to proceed.  History of Present Illness   Luke Griffin is a 63 year old male who presents with severe calf muscle pain.  He has been experiencing severe tightness and pain in his calf muscles for the past four days. The pain is described as 'knotting' and worsens with movement, particularly when pulling his leg back while sleeping on his belly. It is localized more towards the upper part of the calf and is exacerbated by touch. No back or hip pain is noted, indicating the pain is localized to the calf muscles.  He has been using ice and heat for relief and has taken multiple over-the-counter medications including Tylenol, Aleve, and aspirin , but reports no significant relief from these treatments. He recalls a similar episode occurring about five years ago, which was alleviated by a steroid or cortisone shot.  He is concerned about his ability to drive to [place]  due to the pain, as he needs to visit his brother who is terminally ill. He has difficulty managing the pain during long drives.  In terms of his medication regimen, he is currently taking blood pressure medications including amlodipine  2.5 mg, lisinopril , hydralazine , and hydrochlorothiazide . His blood pressure is usually controlled at 135/80 but has been higher recently. He monitors his blood pressure at home and notes fluctuations, particularly in the morning before taking his medications.  He has concerns about the side effects of hydrochlorothiazide ,  stating it causes dehydration and frequent urination at night. He also mentions that higher doses of amlodipine  previously made him feel 'stupid'. He takes lisinopril  and amlodipine  at night, and hydralazine  and hydrochlorothiazide  in the morning.        Relevant past medical, surgical, family and social history reviewed and updated as indicated. Interim medical history since our last visit reviewed. Allergies and medications reviewed and updated.  ROS per HPI unless specifically indicated above     Objective:     BP (!) 154/84   Pulse 68   Temp (!) 97.5 F (36.4 C) (Oral)   Wt 178 lb 3.2 oz (80.8 kg)   SpO2 96%   BMI 27.74 kg/m   Wt Readings from Last 3 Encounters:  11/23/23 178 lb 3.2 oz (80.8 kg)  09/03/23 180 lb 9.6 oz (81.9 kg)  07/08/23 185 lb 3.2 oz (84 kg)     Physical Exam Constitutional:      Appearance: Normal appearance.  Pulmonary:     Effort: Pulmonary effort is normal.  Musculoskeletal:        General: Tenderness present. No swelling, deformity or signs of injury. Normal range of motion.     Comments: Right lateral calf tender, no knee or hip joint tenderness  Skin:    Comments: Normal skin color  Neurological:     General: No focal deficit present.     Mental Status: He is alert.  Mental status is at baseline.  Psychiatric:        Mood and Affect: Mood normal.        Behavior: Behavior normal.        Thought Content: Thought content normal.         11/23/2023    9:21 AM 09/03/2023    3:05 PM 07/08/2023    1:49 PM 05/27/2023    2:06 PM 02/05/2023    3:11 PM  Depression screen PHQ 2/9  Decreased Interest 0 0 0 0 0  Down, Depressed, Hopeless 0 0 0 0 0  PHQ - 2 Score 0 0 0 0 0  Altered sleeping 0 0 0 0 0  Tired, decreased energy 0 0 0 0 0  Change in appetite 0 0 0 0 2  Feeling bad or failure about yourself  0 0 0 0 0  Trouble concentrating 0 0 0 0 0  Moving slowly or fidgety/restless 0 0 0 0 0  Suicidal thoughts 0 0 0 0 0  PHQ-9 Score 0 0 0 0 2   Difficult doing work/chores Not difficult at all  Not difficult at all  Not difficult at all       11/23/2023    9:22 AM 09/03/2023    3:05 PM 07/08/2023    1:49 PM 05/27/2023    2:05 PM  GAD 7 : Generalized Anxiety Score  Nervous, Anxious, on Edge 0 0 0 0  Control/stop worrying 0 0 0 0  Worry too much - different things 0 0 0 0  Trouble relaxing 0 0 0 0  Restless 0 0 0 0  Easily annoyed or irritable 0 0 0 0  Afraid - awful might happen 0 0 0 0  Total GAD 7 Score 0 0 0 0  Anxiety Difficulty Not difficult at all  Not difficult at all        Assessment & Plan:  Assessment & Plan   Right leg pain Acute calf muscle pain unresponsive to acetaminophen, naproxen, or aspirin . Previous relief with steroid injection. Informed consent obtained for steroid injection. - Administer steroid injection for muscle pain relief. -     Triamcinolone  Acetonide  Primary hypertension Assessment & Plan: Chronic hypertension with elevated readings, likely pain-related. Reports hydrochlorothiazide  side effects. Plan to simplify regimen by discontinuing hydrochlorothiazide  and increasing amlodipine . - Increase amlodipine  to 10 mg daily. - Discontinue hydrochlorothiazide . - Continue lisinopril  and hydralazine , with hydralazine  only in the morning. - Monitor blood pressure at home and report significant changes. - Provide refills for lisinopril .  Orders: -     amLODIPine  Besylate; Take 0.5 tablets (5 mg total) by mouth daily.  Dispense: 90 tablet; Refill: 3 -     Lisinopril ; Take 1 tablet (40 mg total) by mouth daily.  Dispense: 90 tablet; Refill: 3 -     hydrALAZINE  HCl; Take 1 tablet (10 mg total) by mouth 2 (two) times daily as needed (if blood pressure >150/90 on check take one dose).  Dispense: 180 tablet; Refill: 0     Follow up plan: Return in about 4 weeks (around 12/21/2023) for HTN.  Hadassah Letters, MD

## 2023-11-23 NOTE — Assessment & Plan Note (Signed)
 Chronic hypertension with elevated readings, likely pain-related. Reports hydrochlorothiazide  side effects. Plan to simplify regimen by discontinuing hydrochlorothiazide  and increasing amlodipine . - Increase amlodipine  to 10 mg daily. - Discontinue hydrochlorothiazide . - Continue lisinopril  and hydralazine , with hydralazine  only in the morning. - Monitor blood pressure at home and report significant changes. - Provide refills for lisinopril .

## 2023-11-26 ENCOUNTER — Ambulatory Visit: Payer: Self-pay

## 2023-11-26 ENCOUNTER — Ambulatory Visit: Payer: Self-pay | Admitting: Nurse Practitioner

## 2023-11-26 NOTE — Telephone Encounter (Signed)
 FYI Only or Action Required?: FYI only for provider  Patient was last seen in primary care on 11/23/2023 by Hadassah Letters, MD. Called Nurse Triage reporting Advice Only. Symptoms began several days ago. Interventions attempted: Nothing. Symptoms are: unchanged.  Triage Disposition: Information or Advice Only Call  Patient/caregiver understands and will follow disposition?:                           Reason for Disposition  General information question, no triage required and triager able to answer question  Answer Assessment - Initial Assessment Questions Patient called in to ask what type of injection he received in the office this week. Per chart, patient received Triamcinolone  Acetonide. Patient verbalized understanding. Patient is currently out of town, but stated he would call back next week if he is still in pain.  Protocols used: Information Only Call - No Triage-A-AH

## 2023-11-26 NOTE — Telephone Encounter (Signed)
 No answer, invalid number            Copied From CRM [id number]. Reason for Triage: Pt is wanting a call back to know what type of steroid shot he received on Monday

## 2023-12-01 ENCOUNTER — Encounter: Payer: Self-pay | Admitting: Emergency Medicine

## 2023-12-01 ENCOUNTER — Emergency Department
Admission: EM | Admit: 2023-12-01 | Discharge: 2023-12-01 | Disposition: A | Discharge status: Home / Self Care | Payer: Self-pay | Attending: Emergency Medicine | Admitting: Emergency Medicine

## 2023-12-01 ENCOUNTER — Other Ambulatory Visit: Payer: Self-pay

## 2023-12-01 ENCOUNTER — Emergency Department: Payer: Self-pay

## 2023-12-01 DIAGNOSIS — M5431 Sciatica, right side: Secondary | ICD-10-CM | POA: Insufficient documentation

## 2023-12-01 DIAGNOSIS — I10 Essential (primary) hypertension: Secondary | ICD-10-CM | POA: Insufficient documentation

## 2023-12-01 DIAGNOSIS — R0602 Shortness of breath: Secondary | ICD-10-CM | POA: Insufficient documentation

## 2023-12-01 DIAGNOSIS — E119 Type 2 diabetes mellitus without complications: Secondary | ICD-10-CM | POA: Insufficient documentation

## 2023-12-01 LAB — CBC WITH DIFFERENTIAL/PLATELET
Abs Immature Granulocytes: 0.03 10*3/uL (ref 0.00–0.07)
Basophils Absolute: 0.1 10*3/uL (ref 0.0–0.1)
Basophils Relative: 1 %
Eosinophils Absolute: 0.4 10*3/uL (ref 0.0–0.5)
Eosinophils Relative: 4 %
HCT: 49.1 % (ref 39.0–52.0)
Hemoglobin: 16.8 g/dL (ref 13.0–17.0)
Immature Granulocytes: 0 %
Lymphocytes Relative: 23 %
Lymphs Abs: 2.4 10*3/uL (ref 0.7–4.0)
MCH: 32.7 pg (ref 26.0–34.0)
MCHC: 34.2 g/dL (ref 30.0–36.0)
MCV: 95.5 fL (ref 80.0–100.0)
Monocytes Absolute: 0.6 10*3/uL (ref 0.1–1.0)
Monocytes Relative: 6 %
Neutro Abs: 6.9 10*3/uL (ref 1.7–7.7)
Neutrophils Relative %: 66 %
Platelets: 213 10*3/uL (ref 150–400)
RBC: 5.14 MIL/uL (ref 4.22–5.81)
RDW: 12.4 % (ref 11.5–15.5)
WBC: 10.3 10*3/uL (ref 4.0–10.5)
nRBC: 0 % (ref 0.0–0.2)

## 2023-12-01 LAB — COMPREHENSIVE METABOLIC PANEL WITH GFR
ALT: 40 U/L (ref 0–44)
AST: 30 U/L (ref 15–41)
Albumin: 4.4 g/dL (ref 3.5–5.0)
Alkaline Phosphatase: 54 U/L (ref 38–126)
Anion gap: 8 (ref 5–15)
BUN: 17 mg/dL (ref 6–20)
CO2: 27 mmol/L (ref 22–32)
Calcium: 9.2 mg/dL (ref 8.9–10.3)
Chloride: 105 mmol/L (ref 98–111)
Creatinine, Ser: 0.81 mg/dL (ref 0.61–1.24)
GFR, Estimated: >60 mL/min (ref >60)
Glucose, Bld: 156 mg/dL — ABNORMAL HIGH (ref 70–99)
Potassium: 4 mmol/L (ref 3.5–5.1)
Sodium: 140 mmol/L (ref 135–145)
Total Bilirubin: 1.1 mg/dL (ref 0.0–1.2)
Total Protein: 7.4 g/dL (ref 6.5–8.1)

## 2023-12-01 LAB — TROPONIN I (HIGH SENSITIVITY): Troponin I (High Sensitivity): 7 ng/L (ref <18)

## 2023-12-01 LAB — D-DIMER, QUANTITATIVE: D-Dimer, Quant: 0.42 ug{FEU}/mL (ref 0.00–0.50)

## 2023-12-01 LAB — BRAIN NATRIURETIC PEPTIDE: B Natriuretic Peptide: 30.5 pg/mL (ref 0.0–100.0)

## 2023-12-01 MED ORDER — KETOROLAC TROMETHAMINE 60 MG/2ML IM SOLN
30.0000 mg | Freq: Once | INTRAMUSCULAR | Status: AC
  Administered 2023-12-01: 30 mg via INTRAMUSCULAR
  Filled 2023-12-01: qty 2

## 2023-12-01 MED ORDER — LIDOCAINE 5 % EX PTCH
1.0000 | MEDICATED_PATCH | Freq: Once | CUTANEOUS | Status: DC
  Administered 2023-12-01: 1 via TRANSDERMAL
  Filled 2023-12-01: qty 1

## 2023-12-01 MED ORDER — LIDOCAINE 5 % EX PTCH: 1.0000 | MEDICATED_PATCH | CUTANEOUS | 0 refills | Status: AC

## 2023-12-01 MED ORDER — METHOCARBAMOL 500 MG PO TABS: 500.0000 mg | ORAL_TABLET | Freq: Three times a day (TID) | ORAL | 0 refills | Status: DC | PRN

## 2023-12-01 MED ORDER — METHOCARBAMOL 500 MG PO TABS
1000.0000 mg | ORAL_TABLET | Freq: Once | ORAL | Status: AC
  Administered 2023-12-01: 1000 mg via ORAL
  Filled 2023-12-01: qty 2

## 2023-12-01 NOTE — Discharge Instructions (Addendum)
 I have sent topical patches and muscle relaxers to your pharmacy for you to use as needed.  You can also take Tylenol as well.  Please follow-up with your primary care provider as needed.  Pain symptoms should likely resolve within the next 1 to 2 weeks but you can use the rehab exercises provided to assist with that as it may be related to sciatica which is a nerve inflammation or a muscle strain.  No evidence of a blood clot today.

## 2023-12-01 NOTE — ED Notes (Signed)
Pt ambulated to the restroom and back without incident.

## 2023-12-01 NOTE — ED Triage Notes (Signed)
 Patient ambulatory to triage with steady gait, without difficulty or distress noted; pt reports awoke with rt leg pain/swelling; recent long trip and his PCP recommended u/s; elevated BP and SHOB this morning

## 2023-12-01 NOTE — ED Provider Notes (Signed)
 [facility] Provider Note    Event Date/Time   First MD Initiated Contact with Patient 12/01/23 0715     (approximate)   History   Shortness of Breath   HPI Luke Griffin is a 63 y.o. male with history of HTN, HLD, DM2 presenting today for leg pain.  Patient states he was recently seen by his PCP 9 days ago for leg pain on his right side.  Most of the pain is between his buttocks and his knee.  He then had a long drive to [place]  and his PCP was concerned for blood clot given ongoing pain symptoms that did not resolve with cortisone shot.  He states he has a difficult time laying flat and trying to move his leg causes extreme pain.  Denies fever, cough, congestion, shortness of breath, chest pain.  No history of blood clots.     Physical Exam   Triage Vital Signs: ED Triage Vitals  Encounter Vitals Group     BP 12/01/23 0629 (!) 175/94     Girls Systolic BP Percentile --      Girls Diastolic BP Percentile --      Boys Systolic BP Percentile --      Boys Diastolic BP Percentile --      Pulse Rate 12/01/23 0629 85     Resp 12/01/23 0629 19     Temp 12/01/23 0629 98.6 F (37 C)     Temp Source 12/01/23 0629 Oral     SpO2 12/01/23 0629 98 %     Weight 12/01/23 0612 177 lb (80.3 kg)     Height 12/01/23 0612 5' 7 (1.702 m)     Head Circumference --      Peak Flow --      Pain Score 12/01/23 0612 10     Pain Loc --      Pain Education --      Exclude from Growth Chart --     Most recent vital signs: Vitals:   12/01/23 0629  BP: (!) 175/94  Pulse: 85  Resp: 19  Temp: 98.6 F (37 C)  SpO2: 98%   Physical Exam: I have reviewed the vital signs and nursing notes. General: Awake, alert, no acute distress.  Nontoxic appearing. Head:  Atraumatic, normocephalic.   ENT:  EOM intact, PERRL. Oral mucosa is pink and moist with no lesions. Neck: Neck is supple with full range of motion, No meningeal signs. Cardiovascular:  RRR, No murmurs.  Peripheral pulses palpable and equal bilaterally. Respiratory:  Symmetrical chest wall expansion.  No rhonchi, rales, or wheezes.  Good air movement throughout.  No use of accessory muscles.   Musculoskeletal:  No cyanosis or edema. Moving extremities with full ROM.  No tenderness to L-spine.  Mild tenderness palpation over right gluteus.  Positive straight leg raise test on right side. Abdomen:  Soft, nontender, nondistended. Neuro:  GCS 15, moving all four extremities, interacting appropriately. Speech clear. Psych:  Calm, appropriate.   Skin:  Warm, dry, no rash.    ED Results / Procedures / Treatments   Labs (all labs ordered are listed, but only abnormal results are displayed) Labs Reviewed  COMPREHENSIVE METABOLIC PANEL WITH GFR - Abnormal; Notable for the following components:      Result Value   Glucose, Bld 156 (*)    All other components within normal limits  CBC WITH DIFFERENTIAL/PLATELET  D-DIMER, QUANTITATIVE  BRAIN NATRIURETIC PEPTIDE  TROPONIN I (HIGH SENSITIVITY)  EKG My EKG interpretation: Rate of 73, normal sinus rhythm, normal axis, normal intervals.  No acute ST elevations or depressions   RADIOLOGY Independently interpreted ultrasound and chest x-ray with no acute pathology   PROCEDURES:  Critical Care performed: No  Procedures   MEDICATIONS ORDERED IN ED: Medications  lidocaine (LIDODERM) 5 % 1 patch (1 patch Transdermal Patch Applied 12/01/23 0739)  ketorolac (TORADOL) injection 30 mg (30 mg Intramuscular Given 12/01/23 0739)  methocarbamol (ROBAXIN) tablet 1,000 mg (1,000 mg Oral Given 12/01/23 0737)     IMPRESSION / MDM / ASSESSMENT AND PLAN / ED COURSE  I reviewed the triage vital signs and the nursing notes.                              Differential diagnosis includes, but is not limited to, sciatica, muscle strain, DVT  Patient's presentation is most consistent with acute complicated illness / injury requiring diagnostic  workup.  Patient is a 63 year old male presenting today for right posterior thigh pain x 9 days worse with straight leg raise test on right side.  Exam appears most consistent with sciatica although given his recent long road trip, and PCP worried about DVT, will get ultrasound for further evaluation.  He has absolutely no chest pain or shortness of breath.  Vital signs are stable outside of hypertension which may be secondary to pain.  Laboratory workup ordered in triage with unremarkable CBC, troponin, CMP, BNP, D-dimer.  Chest x-ray with no acute pathology and EKG unremarkable.  Patient given Toradol, Robaxin, and Lidoderm while awaiting results of ultrasound.  Ultrasound shows no evidence of DVT.  Patient was reassessed and feeling better at this time.  More suspicious of either sciatica or muscle strain based on symptom exacerbation with movement at this time.  Otherwise no acute pathology and safer discharge.  Sent home with Lidoderm and Robaxin and continued use of Tylenol.  Sent with rehab instructions as well.     FINAL CLINICAL IMPRESSION(S) / ED DIAGNOSES   Final diagnoses:  Sciatica of right side     Rx / DC Orders   ED Discharge Orders          Ordered    methocarbamol (ROBAXIN) 500 MG tablet  Every 8 hours PRN        12/01/23 1001    lidocaine (LIDODERM) 5 %  Every 24 hours        12/01/23 1001             Note:  This document was prepared using Dragon voice recognition software and may include unintentional dictation errors.   Kandee Orion, MD 12/01/23 1003

## 2023-12-08 ENCOUNTER — Ambulatory Visit: Payer: Self-pay | Admitting: *Deleted

## 2023-12-08 ENCOUNTER — Ambulatory Visit: Payer: Self-pay | Admitting: Nurse Practitioner

## 2023-12-08 NOTE — Telephone Encounter (Signed)
 FYI Only or Action Required?: FYI only for provider.  Patient was last seen in primary care on 11/23/2023 by Herold Hadassah SQUIBB, MD. Called Nurse Triage reporting Fever (COVID test inconclusive). Symptoms began yesterday. Interventions attempted: Other: rest, isolation. Symptoms are: gradually worsening.  Triage Disposition: Home Care  Patient/caregiver understands and will follow disposition? Patient called to cancel appointment this afternoon- offered to change to virtual- patient declined.    Reason for Disposition  [1] COVID-19 infection suspected by caller or triager AND [2] mild symptoms (cough, fever, or others) AND [3] has not gotten tested yet  Answer Assessment - Initial Assessment Questions 1. COVID-19 DIAGNOSIS: How do you know that you have COVID? (e.g., positive lab test or self-test, diagnosed by doctor or NP/PA, symptoms after exposure).     *No Answer* 2. COVID-19 EXPOSURE: Was there any known exposure to COVID before the symptoms began? CDC Definition of close contact: within 6 feet (2 meters) for a total of 15 minutes or more over a 24-hour period.      *No Answer* 3. ONSET: When did the COVID-19 symptoms start?      Yesterday afternoon 4. WORST SYMPTOM: What is your worst symptom? (e.g., cough, fever, shortness of breath, muscle aches)     Lack of appetite, not feeling well, fever 5. COUGH: Do you have a cough? If Yes, ask: How bad is the cough?       Slight cough 6. FEVER: Do you have a fever? If Yes, ask: What is your temperature, how was it measured, and when did it start?     Unsure of temperature- did not take it today 7. RESPIRATORY STATUS: Describe your breathing? (e.g., normal; shortness of breath, wheezing, unable to speak)      No SOB 8. BETTER-SAME-WORSE: Are you getting better, staying the same or getting worse compared to yesterday?  If getting worse, ask, In what way?     Worse- fatigue 9. OTHER SYMPTOMS: Do you have any other  symptoms?  (e.g., chills, fatigue, headache, loss of smell or taste, muscle pain, sore throat)     Fatigue, lack of appetite, fever 10. HIGH RISK DISEASE: Do you have any chronic medical problems? (e.g., asthma, heart or lung disease, weak immune system, obesity, etc.)       hypertension 11. VACCINE: Have you had the COVID-19 vaccine? If Yes, ask: Which one, how many shots, when did you get it?       Patient states he has had all 4 vaccines  Protocols used: Coronavirus (COVID-19) Diagnosed or Suspected-A-AH    : Copied from CRM [id number]. Topic: Clinical - Red Word Triage >> Dec 08, 2023  8:08 AM Ivette P wrote: Red Word that prompted transfer to Nurse Triage: Pt feels weak, running fever yesterday. Believes has covid.

## 2024-01-13 ENCOUNTER — Other Ambulatory Visit: Payer: Self-pay | Admitting: Nurse Practitioner

## 2024-01-14 NOTE — Telephone Encounter (Signed)
 Requested Prescriptions  Pending Prescriptions Disp Refills   albuterol  (VENTOLIN  HFA) 108 (90 Base) MCG/ACT inhaler [Pharmacy Med Name: Albuterol  Sulfate HFA 108 (90 Base) MCG/ACT Inhalation Aerosol Solution] 9 g 2    Sig: INHALE 2 PUFFS BY MOUTH EVERY 6 HOURS AS NEEDED FOR WHEEZING FOR SHORTNESS OF BREATH     Pulmonology:  Beta Agonists 2 Failed - 01/14/2024 11:03 AM      Failed - Last BP in normal range    BP Readings from Last 1 Encounters:  12/01/23 (!) 160/88         Passed - Last Heart Rate in normal range    Pulse Readings from Last 1 Encounters:  12/01/23 80         Passed - Valid encounter within last 12 months    Recent Outpatient Visits           1 month ago Right leg pain   [place] [facility] Herold Hadassah SQUIBB, MD   4 months ago Primary hypertension   [place] [facility] Melvin Pao, NP

## 2024-01-22 ENCOUNTER — Telehealth: Payer: Self-pay

## 2024-01-22 NOTE — Telephone Encounter (Signed)
 Please reach out to patient and schedule for next available. Patient's PCP will not be in office next week.

## 2024-01-22 NOTE — Telephone Encounter (Signed)
 Copied from CRM [id number]. Topic: Clinical - Medical Advice >> Jan 22, 2024 10:55 AM Leonette SQUIBB wrote: Reason for CRM: pt called trying to schedule an apt for the first of next week.  There were no app ts available.  He wanted to have a fu for bloop pressure that he missed but also stated he was feeling a little fatigued

## 2024-01-26 ENCOUNTER — Encounter: Payer: Self-pay | Admitting: Family Medicine

## 2024-01-26 ENCOUNTER — Ambulatory Visit (INDEPENDENT_AMBULATORY_CARE_PROVIDER_SITE_OTHER): Payer: Self-pay | Admitting: Family Medicine

## 2024-01-26 VITALS — BP 154/88 | HR 73 | Temp 97.9°F | Ht 67.0 in | Wt 173.6 lb

## 2024-01-26 DIAGNOSIS — Z114 Encounter for screening for human immunodeficiency virus [HIV]: Secondary | ICD-10-CM

## 2024-01-26 DIAGNOSIS — E118 Type 2 diabetes mellitus with unspecified complications: Secondary | ICD-10-CM | POA: Insufficient documentation

## 2024-01-26 DIAGNOSIS — I10 Essential (primary) hypertension: Secondary | ICD-10-CM

## 2024-01-26 DIAGNOSIS — Z1159 Encounter for screening for other viral diseases: Secondary | ICD-10-CM

## 2024-01-26 DIAGNOSIS — H9313 Tinnitus, bilateral: Secondary | ICD-10-CM

## 2024-01-26 DIAGNOSIS — Z125 Encounter for screening for malignant neoplasm of prostate: Secondary | ICD-10-CM

## 2024-01-26 DIAGNOSIS — E782 Mixed hyperlipidemia: Secondary | ICD-10-CM

## 2024-01-26 DIAGNOSIS — Z5971 Insufficient health insurance coverage: Secondary | ICD-10-CM

## 2024-01-26 DIAGNOSIS — R5383 Other fatigue: Secondary | ICD-10-CM

## 2024-01-26 MED ORDER — HYDROCHLOROTHIAZIDE 25 MG PO TABS: 25.0000 mg | ORAL_TABLET | Freq: Every day | ORAL | 0 refills | Status: DC

## 2024-01-26 NOTE — Assessment & Plan Note (Signed)
 Rechecking labs today. Await results. Treat as needed.

## 2024-01-26 NOTE — Assessment & Plan Note (Signed)
 Referral to ENT placed today.

## 2024-01-26 NOTE — Assessment & Plan Note (Signed)
Will recheck labs today. Await results. Treat as needed.

## 2024-01-26 NOTE — Assessment & Plan Note (Signed)
 Running high. Will start him on hydrochlorothiazide . Recheck with PCP in 1 month. Will check labs. Await results.

## 2024-01-26 NOTE — Telephone Encounter (Signed)
 Called patient, lvm for patient to call office to schedule appt.

## 2024-01-26 NOTE — Progress Notes (Signed)
 BP (!) 154/88   Pulse 73   Temp 97.9 F (36.6 C) (Oral)   Ht 5' 7 (1.702 m)   Wt 173 lb 9.6 oz (78.7 kg)   SpO2 97%   BMI 27.19 kg/m    Subjective:    Patient ID: Luke Griffin, male    DOB: Jan 06, 1961, 63 y.o.   MRN: [id number]  HPI: Luke Griffin is a 63 y.o. male  Chief Complaint  Patient presents with   Fatigue   Tinnitus   Dizziness   Hypertension    Running high in the mornings    FATIGUE Duration:  months Severity: severe  Onset: gradual Context when symptoms started:  unknown Symptoms improve with rest: no  Depressive symptoms: no Stress/anxiety: no Insomnia: no  Snoring: no Observed apnea by bed partner: no Daytime hypersomnolence:yes Wakes feeling refreshed: no History of sleep study: no Dysnea on exertion:  no Orthopnea/PND: no Chest pain: no Chronic cough: yes Lower extremity edema: no Arthralgias:no Myalgias: no Weakness: no Rash: no  HYPERTENSION / HYPERLIPIDEMIA Satisfied with current treatment? no Duration of hypertension: chronic BP monitoring frequency: a few times a day BP range: high in the AM and better in the PM BP medication side effects: no Past BP meds: amlodipine , lisinopril , hydralazine  Duration of hyperlipidemia: chronic Cholesterol medication side effects: no Cholesterol supplements: none Past cholesterol medications: crestor  Medication compliance: excellent compliance Aspirin : no Recent stressors: no Recurrent headaches: no Visual changes: no Palpitations: yes Dyspnea: no Chest pain: no Lower extremity edema: no Dizzy/lightheaded: yes  DIABETES Hypoglycemic episodes:unsure Polydipsia/polyuria: no Visual disturbance: yes Chest pain: no Paresthesias: yes Glucose Monitoring: no Taking Insulin?: no Blood Pressure Monitoring: not checking Retinal Examination: Not up to Date Foot Exam: Up to Date Diabetic Education: Not Completed Pneumovax: Not up to Date Influenza: Not up to Date Aspirin :  no   Relevant past medical, surgical, family and social history reviewed and updated as indicated. Interim medical history since our last visit reviewed. Allergies and medications reviewed and updated.  Review of Systems  Constitutional:  Positive for fatigue. Negative for activity change, appetite change, chills, diaphoresis, fever and unexpected weight change.  HENT:  Positive for congestion, sinus pressure, sinus pain and tinnitus. Negative for dental problem, drooling, ear discharge, ear pain, facial swelling, hearing loss, mouth sores, nosebleeds, postnasal drip, rhinorrhea, sneezing, sore throat, trouble swallowing and voice change.   Respiratory: Negative.    Cardiovascular:  Positive for palpitations. Negative for chest pain and leg swelling.  Gastrointestinal: Negative.   Musculoskeletal: Negative.   Skin: Negative.   Neurological:  Positive for dizziness and light-headedness. Negative for tremors, seizures, syncope, facial asymmetry, speech difficulty, weakness, numbness and headaches.  Psychiatric/Behavioral: Negative.      Per HPI unless specifically indicated above     Objective:    BP (!) 154/88   Pulse 73   Temp 97.9 F (36.6 C) (Oral)   Ht 5' 7 (1.702 m)   Wt 173 lb 9.6 oz (78.7 kg)   SpO2 97%   BMI 27.19 kg/m   Wt Readings from Last 3 Encounters:  01/26/24 173 lb 9.6 oz (78.7 kg)  12/01/23 177 lb (80.3 kg)  11/23/23 178 lb 3.2 oz (80.8 kg)    Physical Exam Vitals and nursing note reviewed.  Constitutional:      General: He is not in acute distress.    Appearance: Normal appearance. He is normal weight. He is not ill-appearing, toxic-appearing or diaphoretic.  HENT:     Head: Normocephalic and  atraumatic.     Right Ear: External ear normal.     Left Ear: External ear normal.     Nose: Nose normal.     Mouth/Throat:     Mouth: Mucous membranes are moist.     Pharynx: Oropharynx is clear.  Eyes:     General: No scleral icterus.       Right eye: No  discharge.        Left eye: No discharge.     Extraocular Movements: Extraocular movements intact.     Conjunctiva/sclera: Conjunctivae normal.     Pupils: Pupils are equal, round, and reactive to light.  Cardiovascular:     Rate and Rhythm: Normal rate and regular rhythm.     Pulses: Normal pulses.     Heart sounds: Normal heart sounds. No murmur heard.    No friction rub. No gallop.  Pulmonary:     Effort: Pulmonary effort is normal. No respiratory distress.     Breath sounds: Normal breath sounds. No stridor. No wheezing, rhonchi or rales.  Chest:     Chest wall: No tenderness.  Musculoskeletal:        General: Normal range of motion.     Cervical back: Normal range of motion and neck supple.  Skin:    General: Skin is warm and dry.     Capillary Refill: Capillary refill takes less than 2 seconds.     Coloration: Skin is not jaundiced or pale.     Findings: No bruising, erythema, lesion or rash.  Neurological:     General: No focal deficit present.     Mental Status: He is alert and oriented to person, place, and time. Mental status is at baseline.  Psychiatric:        Mood and Affect: Mood normal.        Behavior: Behavior normal.        Thought Content: Thought content normal.        Judgment: Judgment normal.     Results for orders placed or performed during the hospital encounter of 12/01/23  CBC with Differential   Collection Time: 12/01/23  6:30 AM  Result Value Ref Range   WBC 10.3 4.0 - 10.5 K/uL   RBC 5.14 4.22 - 5.81 MIL/uL   Hemoglobin 16.8 13.0 - 17.0 g/dL   HCT 50.8 60.9 - 47.9 %   MCV 95.5 80.0 - 100.0 fL   MCH 32.7 26.0 - 34.0 pg   MCHC 34.2 30.0 - 36.0 g/dL   RDW 87.5 88.4 - 84.4 %   Platelets 213 150 - 400 K/uL   nRBC 0.0 0.0 - 0.2 %   Neutrophils Relative % 66 %   Neutro Abs 6.9 1.7 - 7.7 K/uL   Lymphocytes Relative 23 %   Lymphs Abs 2.4 0.7 - 4.0 K/uL   Monocytes Relative 6 %   Monocytes Absolute 0.6 0.1 - 1.0 K/uL   Eosinophils Relative 4 %    Eosinophils Absolute 0.4 0.0 - 0.5 K/uL   Basophils Relative 1 %   Basophils Absolute 0.1 0.0 - 0.1 K/uL   Immature Granulocytes 0 %   Abs Immature Granulocytes 0.03 0.00 - 0.07 K/uL  Comprehensive metabolic panel   Collection Time: 12/01/23  6:30 AM  Result Value Ref Range   Sodium 140 135 - 145 mmol/L   Potassium 4.0 3.5 - 5.1 mmol/L   Chloride 105 98 - 111 mmol/L   CO2 27 22 - 32 mmol/L   Glucose, Bld  156 (H) 70 - 99 mg/dL   BUN 17 6 - 20 mg/dL   Creatinine, Ser 9.18 0.61 - 1.24 mg/dL   Calcium  9.2 8.9 - 10.3 mg/dL   Total Protein 7.4 6.5 - 8.1 g/dL   Albumin 4.4 3.5 - 5.0 g/dL   AST 30 15 - 41 U/L   ALT 40 0 - 44 U/L   Alkaline Phosphatase 54 38 - 126 U/L   Total Bilirubin 1.1 0.0 - 1.2 mg/dL   GFR, Estimated >39 >39 mL/min   Anion gap 8 5 - 15  D-dimer, quantitative   Collection Time: 12/01/23  6:30 AM  Result Value Ref Range   D-Dimer, Quant 0.42 0.00 - 0.50 ug/mL-FEU  Brain natriuretic peptide   Collection Time: 12/01/23  6:30 AM  Result Value Ref Range   B Natriuretic Peptide 30.5 0.0 - 100.0 pg/mL  Troponin I (High Sensitivity)   Collection Time: 12/01/23  6:30 AM  Result Value Ref Range   Troponin I (High Sensitivity) 7 <18 ng/L      Assessment & Plan:   Problem List Items Addressed This Visit       Cardiovascular and Mediastinum   Primary hypertension   Running high. Will start him on hydrochlorothiazide . Recheck with PCP in 1 month. Will check labs. Await results.       Relevant Medications   hydrochlorothiazide  (HYDRODIURIL ) 25 MG tablet   Other Relevant Orders   CBC with Differential/Platelet   Comprehensive metabolic panel with GFR   Microalbumin, Urine Waived     Endocrine   Controlled type 2 diabetes mellitus with complication, without long-term current use of insulin (HCC)   Will recheck labs today. Await results. Treat as needed.       Relevant Orders   Bayer DCA Hb A1c Waived   CBC with Differential/Platelet   Comprehensive  metabolic panel with GFR   Lipid Panel w/o Chol/HDL Ratio   Microalbumin, Urine Waived     Other   Tinnitus of both ears   Referral to ENT placed today.       Relevant Orders   Ambulatory referral to ENT   Hyperlipidemia   Rechecking labs today. Await results. Treat as needed.       Relevant Medications   hydrochlorothiazide  (HYDRODIURIL ) 25 MG tablet   Other Visit Diagnoses       Fatigue, unspecified type    -  Primary   Of unclear etiology. Will check labs. Needs low-dose CT and sleep study, but will hold on those until he has insurance. Continue to monitor.   Relevant Orders   CBC with Differential/Platelet   Comprehensive metabolic panel with GFR   TSH   VITAMIN D  25 Hydroxy (Vit-D Deficiency, Fractures)   Ambulatory referral to Sleep Studies     Screening for prostate cancer       Labs drawn today. Await results.   Relevant Orders   CBC with Differential/Platelet   Comprehensive metabolic panel with GFR   PSA     Need for hepatitis C screening test       Labs drawn today. Await results.   Relevant Orders   Hepatitis C Antibody     Screening for HIV (human immunodeficiency virus)       Labs drawn today. Await results.   Relevant Orders   HIV Antibody (routine testing w rflx)     Uninsured       Has been feeling terrible for over a year. Will check labs and get  social work involved to see if they can help with insurance/programs. Await their input.   Relevant Orders   AMB Referral [facility] Care Management        Follow up plan: Return in about 4 weeks (around 02/23/2024), or with PCP.

## 2024-01-27 ENCOUNTER — Telehealth: Payer: Self-pay

## 2024-01-27 LAB — CBC WITH DIFFERENTIAL/PLATELET
Basophils Absolute: 0.1 x10E3/uL (ref 0.0–0.2)
Basos: 1 %
EOS (ABSOLUTE): 0.3 x10E3/uL (ref 0.0–0.4)
Eos: 3 %
Hematocrit: 47.9 % (ref 37.5–51.0)
Hemoglobin: 16.5 g/dL (ref 13.0–17.7)
Immature Grans (Abs): 0 x10E3/uL (ref 0.0–0.1)
Immature Granulocytes: 0 %
Lymphocytes Absolute: 2.5 x10E3/uL (ref 0.7–3.1)
Lymphs: 29 %
MCH: 33.1 pg — ABNORMAL HIGH (ref 26.6–33.0)
MCHC: 34.4 g/dL (ref 31.5–35.7)
MCV: 96 fL (ref 79–97)
Monocytes Absolute: 0.7 x10E3/uL (ref 0.1–0.9)
Monocytes: 8 %
Neutrophils Absolute: 5.2 x10E3/uL (ref 1.4–7.0)
Neutrophils: 59 %
Platelets: 191 x10E3/uL (ref 150–450)
RBC: 4.98 x10E6/uL (ref 4.14–5.80)
RBC: 4.98 x10E6/uL (ref 4.14–5.80)
RDW: 12.4 % (ref 11.6–15.4)
WBC: 8.7 x10E3/uL (ref 3.4–10.8)

## 2024-01-27 LAB — LIPID PANEL W/O CHOL/HDL RATIO
Cholesterol, Total: 197 mg/dL (ref 100–199)
HDL: 40 mg/dL (ref >39)
LDL Chol Calc (NIH): 118 mg/dL — ABNORMAL HIGH (ref 0–99)
Triglycerides: 220 mg/dL — ABNORMAL HIGH (ref 0–149)
VLDL Cholesterol Cal: 39 mg/dL (ref 5–40)

## 2024-01-27 LAB — VITAMIN D 25 HYDROXY (VIT D DEFICIENCY, FRACTURES): Vit D, 25-Hydroxy: 32 ng/mL (ref 30.0–100.0)

## 2024-01-27 LAB — COMPREHENSIVE METABOLIC PANEL WITH GFR
ALT: 28 IU/L (ref 0–44)
AST: 23 IU/L (ref 0–40)
Albumin: 4.6 g/dL (ref 3.8–4.9)
Alkaline Phosphatase: 71 IU/L (ref 44–121)
BUN/Creatinine Ratio: 16 (ref 10–24)
BUN: 14 mg/dL (ref 8–27)
Bilirubin Total: 0.4 mg/dL (ref 0.0–1.2)
CO2: 24 mmol/L (ref 20–29)
Calcium: 9.4 mg/dL (ref 8.6–10.2)
Chloride: 103 mmol/L (ref 96–106)
Creatinine, Ser: 0.85 mg/dL (ref 0.76–1.27)
Globulin, Total: 2.2 g/dL (ref 1.5–4.5)
Glucose: 74 mg/dL (ref 70–99)
Potassium: 4.1 mmol/L (ref 3.5–5.2)
Sodium: 141 mmol/L (ref 134–144)
Total Protein: 6.8 g/dL (ref 6.0–8.5)
eGFR: 99 mL/min/1.73 (ref >59)

## 2024-01-27 LAB — BAYER DCA HB A1C WAIVED: HB A1C (BAYER DCA - WAIVED): 6.2 % — ABNORMAL HIGH (ref 4.8–5.6)

## 2024-01-27 LAB — HEPATITIS C ANTIBODY: Hep C Virus Ab: NONREACTIVE

## 2024-01-27 LAB — MICROALBUMIN, URINE WAIVED
Creatinine, Urine Waived: 200 mg/dL (ref 10–300)
Microalb, Ur Waived: 80 mg/L — ABNORMAL HIGH (ref 0–19)

## 2024-01-27 LAB — HIV ANTIBODY (ROUTINE TESTING W REFLEX): HIV Screen 4th Generation wRfx: NONREACTIVE

## 2024-01-27 LAB — TSH: TSH: 1.47 u[IU]/mL (ref 0.450–4.500)

## 2024-01-27 LAB — PSA: Prostate Specific Ag, Serum: 24.7 ng/mL — ABNORMAL HIGH (ref 0.0–4.0)

## 2024-01-27 NOTE — Progress Notes (Unsigned)
 Complex Care Management Note Care Guide Note  01/27/2024 Name: Luke Griffin MRN: [id number] DOB: 02-10-1961   Complex Care Management Outreach Attempts: An unsuccessful telephone outreach was attempted today to offer the patient information about available complex care management services.  Follow Up Plan:  Additional outreach attempts will be made to offer the patient complex care management information and services.   Encounter Outcome:  No Answer  Jeoffrey Buffalo , RMA     [place]  [facility], [facility] Guide  Direct Dial: [phone]  Website: [email or web address]

## 2024-01-28 NOTE — Telephone Encounter (Signed)
 Scheduled

## 2024-01-31 ENCOUNTER — Ambulatory Visit: Payer: Self-pay | Admitting: Family Medicine

## 2024-01-31 DIAGNOSIS — R972 Elevated prostate specific antigen [PSA]: Secondary | ICD-10-CM

## 2024-02-01 NOTE — Progress Notes (Unsigned)
 Complex Care Management Note Care Guide Note  02/01/2024 Name: Luke Griffin MRN: [id number] DOB: February 27, 1961   Complex Care Management Outreach Attempts: A second unsuccessful outreach was attempted today to offer the patient with information about available complex care management services.  Follow Up Plan:  Additional outreach attempts will be made to offer the patient complex care management information and services.   Encounter Outcome:  No Answer  Jeoffrey Buffalo , RMA     [place]  [facility], [facility] Guide  Direct Dial: [phone]  Website: [email or web address]

## 2024-02-03 DIAGNOSIS — R972 Elevated prostate specific antigen [PSA]: Secondary | ICD-10-CM | POA: Insufficient documentation

## 2024-02-03 NOTE — Progress Notes (Signed)
   02/08/24 9:42 AM   Luke Griffin [birth year] [id number]  CC: elevated PSA   HPI: Recently saw PCM for health visit Screening PSA ordered, never had performed. PSA 24.7 (01/26/24) No prior PSAs   +20 lb unintended weight loss, +night sweats with soaked sheets +2-3x nocturia, weaker stream, intermittent leakage - progressive over several years   Fhx - does not recall any CaP hx in primary relatives  Hx of DM, HTN, HLD Hx of ED  Lifelong smoker, prior 2 pack a day, now doing 1 pack/day   PMH: Past Medical History:  Diagnosis Date   Allergy    Hypertension    Tachycardia     Surgical History: Past Surgical History:  Procedure Laterality Date   APPENDECTOMY      Social History:  reports that he has been smoking cigarettes. He has a 7.5 pack-year smoking history. He has never used smokeless tobacco. He reports that he does not currently use alcohol. He reports that he does not currently use drugs after having used the following drugs: Marijuana and Cocaine.  Physical Exam: BP (!) 136/95   Pulse (!) 109   Ht 5' 7 (1.702 m)   Wt 172 lb (78 kg)   BMI 26.94 kg/m    Constitutional:  Alert and oriented, No acute distress. Cardiovascular: significant clubbing, cyanosis, or edema. Respiratory: Normal respiratory effort, no increased work of breathing. GU: DRE - 40-50 gram gland, firm nodular LEFT hemigland at base to mid  abnormal Skin: No rashes, bruises or suspicious lesions. Neurologic: Grossly intact, no focal deficits, moving all 4 extremities. Psychiatric: Normal mood and affect.  Laboratory Data:  Latest Reference Range & Units 01/26/24 16:42  Creatinine 0.76 - 1.27 mg/dL 9.14    Latest Reference Range & Units 01/26/24 16:42  Prostate Specific Ag, Serum 0.0 - 4.0 ng/mL 24.7 (H)  (H): Data is abnormally high   Assessment & Plan:    Elevated PSA Assessment & Plan: Initial PSA elevated to 24.7 (01/26/24)  No prior lab work or screening  +DRE - Firm,  nodular LEFT hemigland  Concerning DRE today - need to maintain close follow up through workup. I reviewed his history, lab work with him today. Also included next steps and possible outcomes. High likelihood of requiring diagnostic prostate biopsy, even with negative MRI. MRI may guide targeted biopsies if present. Follow up dependent on MRI results, will communicate via chart.   - proceed with prostate MRI  - repeat PSA  - check UA w/ reflex    Orders: -     Urinalysis, Complete -     PSA; Future -     MR PROSTATE W WO CONTRAST; Future      Penne Skye, MD 02/08/2024  [facility] [address], [address] [place], [place] [address] [phone]

## 2024-02-03 NOTE — Assessment & Plan Note (Addendum)
 Initial PSA elevated to 24.7 (01/26/24)  No prior lab work or screening  +DRE - Firm, nodular LEFT hemigland  Concerning DRE today - need to maintain close follow up through workup. I reviewed his history, lab work with him today. Also included next steps and possible outcomes. High likelihood of requiring diagnostic prostate biopsy, even with negative MRI. MRI may guide targeted biopsies if present. Follow up dependent on MRI results, will communicate via chart.   - proceed with prostate MRI  - repeat PSA  - check UA w/ reflex

## 2024-02-04 NOTE — Progress Notes (Signed)
 Complex Care Management Note Care Guide Note  02/04/2024 Name: Luke Griffin MRN: [id number] DOB: 01/04/1961   Complex Care Management Outreach Attempts: A third unsuccessful outreach was attempted today to offer the patient with information about available complex care management services.  Follow Up Plan:  No further outreach attempts will be made at this time. We have been unable to contact the patient to offer or enroll patient in complex care management services.  Encounter Outcome:  No Answer  Jeoffrey Buffalo , RMA     [place]  [facility], [place] [facility] Guide  Direct Dial: [phone]  Website: [email or web address]

## 2024-02-08 ENCOUNTER — Ambulatory Visit: Payer: Self-pay | Admitting: Urology

## 2024-02-08 VITALS — BP 136/95 | HR 109 | Ht 67.0 in | Wt 172.0 lb

## 2024-02-08 DIAGNOSIS — R972 Elevated prostate specific antigen [PSA]: Secondary | ICD-10-CM

## 2024-02-08 LAB — URINALYSIS, COMPLETE
Bilirubin, UA: NEGATIVE
Glucose, UA: NEGATIVE
Ketones, UA: NEGATIVE
Leukocytes,UA: NEGATIVE
Nitrite, UA: NEGATIVE
RBC, UA: NEGATIVE
Specific Gravity, UA: 1.025 (ref 1.005–1.030)
Urobilinogen, Ur: 0.2 mg/dL (ref 0.2–1.0)
pH, UA: 6 (ref 5.0–7.5)

## 2024-02-08 LAB — MICROSCOPIC EXAMINATION: Bacteria, UA: NONE SEEN

## 2024-02-08 NOTE — Patient Instructions (Signed)
 Please contact Central Scheduling to set up your prostate MRI at [phone].  Prostate MRI Prep:  1- No ejaculation 48 hours prior to exam  2- No caffeine or carbonated beverages on day of the exam  3- Eat light diet evening prior and day of exam  4- Avoid eating 4 hours prior to exam  5- Fleets enema needs to be done 4 hours prior to exam -See below. Can be purchased at the drug store.

## 2024-02-09 LAB — PSA: Prostate Specific Ag, Serum: 26.8 ng/mL — ABNORMAL HIGH (ref 0.0–4.0)

## 2024-02-11 ENCOUNTER — Telehealth: Payer: Self-pay

## 2024-02-11 NOTE — Telephone Encounter (Signed)
 Pt called and would like his test results. I let him know Dr. Had not reviewed them yet but I would send a message to Dr. Almeta is without insurance and wanted to make sure he needed the CT scan first before having it done.

## 2024-02-16 ENCOUNTER — Ambulatory Visit: Payer: Self-pay

## 2024-02-16 ENCOUNTER — Telehealth: Payer: Self-pay

## 2024-02-16 NOTE — Telephone Encounter (Signed)
 Called Patient to talk about scheduling his Prostate biopsy and MRI per Dr. Georganne. He has not read his my chart message. I tried calling patient but number was not reachable. Myrtha Rubinstein, CMA

## 2024-02-18 NOTE — Telephone Encounter (Signed)
 LVM for patient to give us  a call back for results. He does not have an active Mychart

## 2024-02-19 NOTE — Telephone Encounter (Signed)
 3rd attempt to reach pt, LVM for pt to return call. Letter sent out advising pt to reach out to us 

## 2024-02-23 ENCOUNTER — Encounter: Payer: Self-pay | Admitting: Nurse Practitioner

## 2024-02-23 ENCOUNTER — Ambulatory Visit (INDEPENDENT_AMBULATORY_CARE_PROVIDER_SITE_OTHER): Payer: Self-pay | Admitting: Nurse Practitioner

## 2024-02-23 VITALS — BP 142/84 | HR 87 | Temp 98.1°F | Ht 67.0 in | Wt 173.8 lb

## 2024-02-23 DIAGNOSIS — R972 Elevated prostate specific antigen [PSA]: Secondary | ICD-10-CM

## 2024-02-23 DIAGNOSIS — I10 Essential (primary) hypertension: Secondary | ICD-10-CM

## 2024-02-23 DIAGNOSIS — E118 Type 2 diabetes mellitus with unspecified complications: Secondary | ICD-10-CM

## 2024-02-23 MED ORDER — HYDRALAZINE HCL 10 MG PO TABS: 10.0000 mg | ORAL_TABLET | Freq: Two times a day (BID) | ORAL | 0 refills | Status: DC

## 2024-02-23 MED ORDER — AMLODIPINE BESYLATE 10 MG PO TABS: 10.0000 mg | ORAL_TABLET | Freq: Every day | ORAL | 1 refills | Status: DC

## 2024-02-23 NOTE — Patient Instructions (Signed)
 Type into [employer] [place] Medicaid Application  Click on how to apply for Medicaid

## 2024-02-23 NOTE — Assessment & Plan Note (Signed)
 Followed by Urology.  Reviewed lab work from Urology with patient.  Encouraged him to apply for Medicaid. He does have his MRI scheduled for 10/6.

## 2024-02-23 NOTE — Assessment & Plan Note (Signed)
 Chronic.  Controlled without Metformin .  He is on Lisinopril  40mg .  Continue with ACE for kidney protection.  Encouraged patient to restart Rosuvastatin  for Cardiac protection.  Labs ordered today.  Return to clinic in 6 months for reevaluation.  Call sooner if concerns arise.

## 2024-02-23 NOTE — Assessment & Plan Note (Signed)
 Running high. Does not tolerate HCTZ.  Only taking Amlodipine  5mg  daily and Hydralazine  10mg  daily.  Increase Amlodipine  to 10mg  and Hydralazine  10mg  BID.  Likely needs Hydralazine  TID.  Lengthy conversation had regarding how medications work and this importance of taking them as prescribed.  Follow up in 6 weeks.  Call sooner if concerns arise.

## 2024-02-23 NOTE — Progress Notes (Signed)
 BP (!) 142/84 (BP Location: Left Arm, Cuff Size: Normal)   Pulse 87   Temp 98.1 F (36.7 C) (Oral)   Ht 5' 7 (1.702 m)   Wt 173 lb 12.8 oz (78.8 kg)   SpO2 93%   BMI 27.22 kg/m    Subjective:    Patient ID: Luke Griffin, male    DOB: [birth year], 63 y.o.   MRN: [id number]  HPI: Luke Griffin is a 63 y.o. male  Chief Complaint  Patient presents with   Hypertension    Patient states that he did not start the hydrochlorothiazide  because it makes him very dehydrated.    HYPERTENSION / HYPERLIPIDEMIA Patient states he can't tolerated hydrochlorothiazide  dehydrates him and makes him feel funny and crazy thirsty.   Satisfied with current treatment? no Duration of hypertension: chronic BP monitoring frequency: a few times a day BP range: high in the AM and better in the PM BP medication side effects: no Past BP meds: amlodipine , lisinopril , hydralazine  Duration of hyperlipidemia: chronic Cholesterol medication side effects: no Cholesterol supplements: none Past cholesterol medications: crestor  Medication compliance: excellent compliance Aspirin : no Recent stressors: no Recurrent headaches: no Visual changes: no Palpitations: no Dyspnea: no Chest pain: no Lower extremity edema: no Dizzy/lightheaded: yes  DIABETES Patient states he is not taking the Metformin .  He would like to control his diabetes with diet.   Hypoglycemic episodes:unsure Polydipsia/polyuria: no Visual disturbance: yes Chest pain: no Paresthesias: yes Glucose Monitoring: no Taking Insulin?: no Blood Pressure Monitoring: not checking Retinal Examination: Not up to Date Foot Exam: Up to Date Diabetic Education: Not Completed Pneumovax: Not up to Date Influenza: Not up to Date Aspirin : no  The 10-year ASCVD risk score (Arnett DK, et al., 2019) is: 37.3%   Values used to calculate the score:     Age: 63 years     Clincally relevant sex: Male     Is Non-Hispanic African American: No      Diabetic: Yes     Tobacco smoker: Yes     Systolic Blood Pressure: 142 mmHg     Is BP treated: Yes     HDL Cholesterol: 40 mg/dL     Total Cholesterol: 197 mg/dL  He continues to have fatigue.  He saw his Urology on 02/08/24 with a PSA of 26.7.    Relevant past medical, surgical, family and social history reviewed and updated as indicated. Interim medical history since our last visit reviewed. Allergies and medications reviewed and updated.  Review of Systems  Constitutional:  Positive for fatigue.  Eyes:  Negative for visual disturbance.  Respiratory:  Negative for chest tightness and shortness of breath.   Cardiovascular:  Negative for chest pain, palpitations and leg swelling.  Neurological:  Positive for dizziness. Negative for light-headedness and headaches.    Per HPI unless specifically indicated above     Objective:    BP (!) 142/84 (BP Location: Left Arm, Cuff Size: Normal)   Pulse 87   Temp 98.1 F (36.7 C) (Oral)   Ht 5' 7 (1.702 m)   Wt 173 lb 12.8 oz (78.8 kg)   SpO2 93%   BMI 27.22 kg/m   Wt Readings from Last 3 Encounters:  02/23/24 173 lb 12.8 oz (78.8 kg)  02/08/24 172 lb (78 kg)  01/26/24 173 lb 9.6 oz (78.7 kg)    Physical Exam Vitals and nursing note reviewed.  Constitutional:      General: He is not in acute distress.    Appearance:  Normal appearance. He is normal weight. He is not ill-appearing, toxic-appearing or diaphoretic.  HENT:     Head: Normocephalic and atraumatic.     Right Ear: External ear normal.     Left Ear: External ear normal.     Nose: Nose normal.     Mouth/Throat:     Mouth: Mucous membranes are moist.     Pharynx: Oropharynx is clear.  Eyes:     General: No scleral icterus.       Right eye: No discharge.        Left eye: No discharge.     Extraocular Movements: Extraocular movements intact.     Conjunctiva/sclera: Conjunctivae normal.     Pupils: Pupils are equal, round, and reactive to light.  Cardiovascular:      Rate and Rhythm: Normal rate and regular rhythm.     Pulses: Normal pulses.     Heart sounds: Normal heart sounds. No murmur heard.    No friction rub. No gallop.  Pulmonary:     Effort: Pulmonary effort is normal. No respiratory distress.     Breath sounds: Normal breath sounds. No stridor. No wheezing, rhonchi or rales.  Chest:     Chest wall: No tenderness.  Musculoskeletal:        General: Normal range of motion.     Cervical back: Normal range of motion and neck supple.  Skin:    General: Skin is warm and dry.     Capillary Refill: Capillary refill takes less than 2 seconds.     Coloration: Skin is not jaundiced or pale.     Findings: No bruising, erythema, lesion or rash.  Neurological:     General: No focal deficit present.     Mental Status: He is alert and oriented to person, place, and time. Mental status is at baseline.  Psychiatric:        Mood and Affect: Mood normal.        Behavior: Behavior normal.        Thought Content: Thought content normal.        Judgment: Judgment normal.     Results for orders placed or performed in visit on 02/08/24  PSA   Collection Time: 02/08/24  9:50 AM  Result Value Ref Range   Prostate Specific Ag, Serum 26.8 (H) 0.0 - 4.0 ng/mL  Microscopic Examination   Collection Time: 02/08/24 10:03 AM   Urine  Result Value Ref Range   WBC, UA 0-5 0 - 5 /hpf   RBC, Urine 0-2 0 - 2 /hpf   Epithelial Cells (non renal) 0-10 0 - 10 /hpf   Bacteria, UA None seen None seen/Few  Urinalysis, Complete   Collection Time: 02/08/24 10:03 AM  Result Value Ref Range   Specific Gravity, UA 1.025 1.005 - 1.030   pH, UA 6.0 5.0 - 7.5   Color, UA Yellow Yellow   Appearance Ur Clear Clear   Leukocytes,UA Negative Negative   Protein,UA Trace Negative/Trace   Glucose, UA Negative Negative   Ketones, UA Negative Negative   RBC, UA Negative Negative   Bilirubin, UA Negative Negative   Urobilinogen, Ur 0.2 0.2 - 1.0 mg/dL   Nitrite, UA Negative  Negative   Microscopic Examination Comment    Microscopic Examination See below:       Assessment & Plan:   Problem List Items Addressed This Visit       Cardiovascular and Mediastinum   Primary hypertension   Running high. Does  not tolerate HCTZ.  Only taking Amlodipine  5mg  daily and Hydralazine  10mg  daily.  Increase Amlodipine  to 10mg  and Hydralazine  10mg  BID.  Likely needs Hydralazine  TID.  Lengthy conversation had regarding how medications work and this importance of taking them as prescribed.  Follow up in 6 weeks.  Call sooner if concerns arise.       Relevant Medications   hydrALAZINE  (APRESOLINE ) 10 MG tablet   amLODipine  (NORVASC ) 10 MG tablet     Endocrine   Controlled type 2 diabetes mellitus with complication, without long-term current use of insulin (HCC) - Primary   Chronic.  Controlled without Metformin .  He is on Lisinopril  40mg .  Continue with ACE for kidney protection.  Encouraged patient to restart Rosuvastatin  for Cardiac protection.  Labs ordered today.  Return to clinic in 6 months for reevaluation.  Call sooner if concerns arise.          Other   Elevated PSA   Followed by Urology.  Reviewed lab work from Urology with patient.  Encouraged him to apply for Medicaid. He does have his MRI scheduled for 10/6.          Follow up plan: Return in about 6 weeks (around 04/05/2024) for BP Check.

## 2024-02-29 ENCOUNTER — Telehealth: Payer: Self-pay

## 2024-02-29 NOTE — Telephone Encounter (Signed)
 Pt called back and was given PSA results and Dr Cy recommendation going forward. Pt has MRI scheduled on 10/6 but has no insurance and is having to pay out of pocket. Pt has been in communication with help through the state but hasn't heard back anything yet. He will call us  back as soon as he knows more to try and schedule the Prostrate Biopsy after his MRI.

## 2024-03-21 ENCOUNTER — Ambulatory Visit: Payer: MEDICAID

## 2024-03-25 NOTE — Telephone Encounter (Signed)
 LVM for pt to return call. To schedule prostate biopsy.

## 2024-03-29 NOTE — Telephone Encounter (Addendum)
 2nd attempt to reach pt, LVM for pt to return call

## 2024-04-05 ENCOUNTER — Ambulatory Visit: Payer: Self-pay | Admitting: Nurse Practitioner

## 2024-04-05 NOTE — Progress Notes (Deleted)
   There were no vitals taken for this visit.   Subjective:    Patient ID: Luke Griffin, male    DOB: 1961/05/29, 63 y.o.   MRN: [id number]  HPI: Luke Griffin is a 63 y.o. male  No chief complaint on file.  HYPERTENSION / HYPERLIPIDEMIA Patient states he can't tolerated hydrochlorothiazide  dehydrates him and makes him feel funny and crazy thirsty.   Satisfied with current treatment? no Duration of hypertension: chronic BP monitoring frequency: a few times a day BP range: high in the AM and better in the PM BP medication side effects: no Past BP meds: amlodipine , lisinopril , hydralazine  Duration of hyperlipidemia: chronic Cholesterol medication side effects: no Cholesterol supplements: none Past cholesterol medications: crestor  Medication compliance: excellent compliance Aspirin : no Recent stressors: no Recurrent headaches: no Visual changes: no Palpitations: no Dyspnea: no Chest pain: no Lower extremity edema: no Dizzy/lightheaded: yes   Relevant past medical, surgical, family and social history reviewed and updated as indicated. Interim medical history since our last visit reviewed. Allergies and medications reviewed and updated.  Review of Systems  Per HPI unless specifically indicated above     Objective:    There were no vitals taken for this visit.  Wt Readings from Last 3 Encounters:  02/23/24 173 lb 12.8 oz (78.8 kg)  02/08/24 172 lb (78 kg)  01/26/24 173 lb 9.6 oz (78.7 kg)    Physical Exam  Results for orders placed or performed in visit on 02/08/24  PSA   Collection Time: 02/08/24  9:50 AM  Result Value Ref Range   Prostate Specific Ag, Serum 26.8 (H) 0.0 - 4.0 ng/mL  Microscopic Examination   Collection Time: 02/08/24 10:03 AM   Urine  Result Value Ref Range   WBC, UA 0-5 0 - 5 /hpf   RBC, Urine 0-2 0 - 2 /hpf   Epithelial Cells (non renal) 0-10 0 - 10 /hpf   Bacteria, UA None seen None seen/Few  Urinalysis, Complete   Collection  Time: 02/08/24 10:03 AM  Result Value Ref Range   Specific Gravity, UA 1.025 1.005 - 1.030   pH, UA 6.0 5.0 - 7.5   Color, UA Yellow Yellow   Appearance Ur Clear Clear   Leukocytes,UA Negative Negative   Protein,UA Trace Negative/Trace   Glucose, UA Negative Negative   Ketones, UA Negative Negative   RBC, UA Negative Negative   Bilirubin, UA Negative Negative   Urobilinogen, Ur 0.2 0.2 - 1.0 mg/dL   Nitrite, UA Negative Negative   Microscopic Examination Comment    Microscopic Examination See below:       Assessment & Plan:   Problem List Items Addressed This Visit   None    Follow up plan: No follow-ups on file.

## 2024-04-08 ENCOUNTER — Telehealth: Payer: Self-pay

## 2024-04-08 ENCOUNTER — Other Ambulatory Visit: Payer: Self-pay | Admitting: Nurse Practitioner

## 2024-04-08 DIAGNOSIS — I10 Essential (primary) hypertension: Secondary | ICD-10-CM

## 2024-04-08 NOTE — Telephone Encounter (Signed)
 Copied from CRM #[id number]. Topic: Clinical - Medication Question >> Apr 08, 2024 10:04 AM Travis F wrote: Reason for CRM: Patient is calling in because he accidentally spilled his medication in the toilet. Patient says he had 15 pills and spilled them. He was able to take his medication this morning, but he doesn't have any left after that.  hydrALAZINE  (APRESOLINE ) 10 MG tablet [[id number]]

## 2024-04-09 NOTE — Telephone Encounter (Signed)
 Requested Prescriptions  Pending Prescriptions Disp Refills   hydrALAZINE  (APRESOLINE ) 10 MG tablet [Pharmacy Med Name: hydrALAZINE  HCl 10 MG Oral Tablet] 180 tablet 1    Sig: Take 1 tablet by mouth twice daily     Cardiovascular:  Vasodilators Failed - 04/09/2024  9:53 PM      Failed - ANA Screen, Ifa, Serum in normal range and within 360 days    No results found for: ANA, ANATITER, LABANTI       Failed - Last BP in normal range    BP Readings from Last 1 Encounters:  02/23/24 (!) 142/84         Passed - HCT in normal range and within 360 days    Hematocrit  Date Value Ref Range Status  01/26/2024 47.9 37.5 - 51.0 % Final         Passed - HGB in normal range and within 360 days    Hemoglobin  Date Value Ref Range Status  01/26/2024 16.5 13.0 - 17.7 g/dL Final         Passed - RBC in normal range and within 360 days    RBC  Date Value Ref Range Status  01/26/2024 4.98 4.14 - 5.80 x10E6/uL Final  12/01/2023 5.14 4.22 - 5.81 MIL/uL Final         Passed - WBC in normal range and within 360 days    WBC  Date Value Ref Range Status  01/26/2024 8.7 3.4 - 10.8 x10E3/uL Final  12/01/2023 10.3 4.0 - 10.5 K/uL Final         Passed - PLT in normal range and within 360 days    Platelets  Date Value Ref Range Status  01/26/2024 191 150 - 450 x10E3/uL Final         Passed - Valid encounter within last 12 months    Recent Outpatient Visits           1 month ago Controlled type 2 diabetes mellitus with complication, without long-term current use of insulin (HCC)   [place] [facility] Melvin Pao, NP   2 months ago Fatigue, unspecified type   [place] [facility] [place], Megan P, DO   4 months ago Right leg pain   [place] [facility] Hadassah SQUIBB, MD   7 months ago Primary hypertension   [place] [facility] Melvin Pao, NP

## 2024-04-11 MED ORDER — HYDRALAZINE HCL 10 MG PO TABS: 10.0000 mg | ORAL_TABLET | Freq: Two times a day (BID) | ORAL | 1 refills | Status: AC

## 2024-04-11 NOTE — Telephone Encounter (Signed)
 Can a new prescription be sent in?

## 2024-04-11 NOTE — Telephone Encounter (Signed)
 Medication sent to the pharmacy.

## 2024-04-12 NOTE — Telephone Encounter (Signed)
 Called and LVM letting patient know that Darice sent in a new prescription for him to his pharmacy.

## 2024-04-20 ENCOUNTER — Encounter: Payer: Self-pay | Admitting: Nurse Practitioner

## 2024-04-20 ENCOUNTER — Ambulatory Visit (INDEPENDENT_AMBULATORY_CARE_PROVIDER_SITE_OTHER): Payer: Self-pay | Admitting: Nurse Practitioner

## 2024-04-20 VITALS — BP 122/77 | HR 73 | Temp 98.0°F | Ht 67.0 in | Wt 180.6 lb

## 2024-04-20 DIAGNOSIS — I10 Essential (primary) hypertension: Secondary | ICD-10-CM

## 2024-04-20 NOTE — Progress Notes (Signed)
 BP 122/77   Pulse 73   Temp 98 F (36.7 C) (Oral)   Ht 5' 7 (1.702 m)   Wt 180 lb 9.6 oz (81.9 kg)   SpO2 96%   BMI 28.29 kg/m    Subjective:    Patient ID: Nyshaun Standage, male    DOB: 04-Mar-1961, 63 y.o.   MRN: [id number]  HPI: Orlander Norwood is a 63 y.o. male  Chief Complaint  Patient presents with   Hypertension    Bp @ home running about 132/80   HYPERTENSION / HYPERLIPIDEMIA Patient states he is taking Amlodipine , hydralazine , and Lisinopril .  Not taking the rosuvastatin - watching his diet.   Satisfied with current treatment? no Duration of hypertension: chronic BP monitoring frequency: a few times a day BP range: high in the AM and better in the PM BP medication side effects: no Past BP meds: amlodipine , lisinopril , hydralazine  Duration of hyperlipidemia: chronic Cholesterol medication side effects: no Cholesterol supplements: none Past cholesterol medications: crestor  Medication compliance: excellent compliance Aspirin : no Recent stressors: no Recurrent headaches: no Visual changes: no Palpitations: no Dyspnea: no Chest pain: no Lower extremity edema: no Dizzy/lightheaded: no  Patient is speaking with an attorney to try and help him get insurance.  He has put off the test due to cost and trying to get insurance before he gets it done.  He is hoping to have insurance by November 15.   Relevant past medical, surgical, family and social history reviewed and updated as indicated. Interim medical history since our last visit reviewed. Allergies and medications reviewed and updated.  Review of Systems  Eyes:  Negative for visual disturbance.  Respiratory:  Negative for shortness of breath.   Cardiovascular:  Negative for chest pain and leg swelling.  Neurological:  Negative for light-headedness and headaches.    Per HPI unless specifically indicated above     Objective:    BP 122/77   Pulse 73   Temp 98 F (36.7 C) (Oral)   Ht 5' 7 (1.702 m)    Wt 180 lb 9.6 oz (81.9 kg)   SpO2 96%   BMI 28.29 kg/m   Wt Readings from Last 3 Encounters:  04/20/24 180 lb 9.6 oz (81.9 kg)  02/23/24 173 lb 12.8 oz (78.8 kg)  02/08/24 172 lb (78 kg)    Physical Exam Vitals and nursing note reviewed.  Constitutional:      General: He is not in acute distress.    Appearance: Normal appearance. He is not ill-appearing, toxic-appearing or diaphoretic.  HENT:     Head: Normocephalic.     Right Ear: External ear normal.     Left Ear: External ear normal.     Nose: Nose normal. No congestion or rhinorrhea.     Mouth/Throat:     Mouth: Mucous membranes are moist.  Eyes:     General:        Right eye: No discharge.        Left eye: No discharge.     Extraocular Movements: Extraocular movements intact.     Conjunctiva/sclera: Conjunctivae normal.     Pupils: Pupils are equal, round, and reactive to light.  Cardiovascular:     Rate and Rhythm: Normal rate and regular rhythm.     Heart sounds: No murmur heard. Pulmonary:     Effort: Pulmonary effort is normal. No respiratory distress.     Breath sounds: Normal breath sounds. No wheezing, rhonchi or rales.  Abdominal:     General: Abdomen  is flat. Bowel sounds are normal.  Musculoskeletal:     Cervical back: Normal range of motion and neck supple.  Skin:    General: Skin is warm and dry.     Capillary Refill: Capillary refill takes less than 2 seconds.  Neurological:     General: No focal deficit present.     Mental Status: He is alert and oriented to person, place, and time.  Psychiatric:        Mood and Affect: Mood normal.        Behavior: Behavior normal.        Thought Content: Thought content normal.        Judgment: Judgment normal.     Results for orders placed or performed in visit on 02/08/24  PSA   Collection Time: 02/08/24  9:50 AM  Result Value Ref Range   Prostate Specific Ag, Serum 26.8 (H) 0.0 - 4.0 ng/mL  Microscopic Examination   Collection Time: 02/08/24 10:03 AM    Urine  Result Value Ref Range   WBC, UA 0-5 0 - 5 /hpf   RBC, Urine 0-2 0 - 2 /hpf   Epithelial Cells (non renal) 0-10 0 - 10 /hpf   Bacteria, UA None seen None seen/Few  Urinalysis, Complete   Collection Time: 02/08/24 10:03 AM  Result Value Ref Range   Specific Gravity, UA 1.025 1.005 - 1.030   pH, UA 6.0 5.0 - 7.5   Color, UA Yellow Yellow   Appearance Ur Clear Clear   Leukocytes,UA Negative Negative   Protein,UA Trace Negative/Trace   Glucose, UA Negative Negative   Ketones, UA Negative Negative   RBC, UA Negative Negative   Bilirubin, UA Negative Negative   Urobilinogen, Ur 0.2 0.2 - 1.0 mg/dL   Nitrite, UA Negative Negative   Microscopic Examination Comment    Microscopic Examination See below:       Assessment & Plan:   Problem List Items Addressed This Visit       Cardiovascular and Mediastinum   Primary hypertension - Primary   Chronic.  Controlled.  Continue with current medication regimen of Lisinopril  40mg , Amlodipine  10mg , and Hydralazine  10mg  BID.  Return to clinic in 5 months for reevaluation.  Call sooner if concerns arise.          Follow up plan: Return in about 5 months (around 09/18/2024) for HTN, HLD, DM2 FU.

## 2024-04-20 NOTE — Assessment & Plan Note (Signed)
 Chronic.  Controlled.  Continue with current medication regimen of Lisinopril  40mg , Amlodipine  10mg , and Hydralazine  10mg  BID.  Return to clinic in 5 months for reevaluation.  Call sooner if concerns arise.

## 2024-05-16 ENCOUNTER — Ambulatory Visit (INDEPENDENT_AMBULATORY_CARE_PROVIDER_SITE_OTHER): Payer: Self-pay | Admitting: Family Medicine

## 2024-05-16 ENCOUNTER — Encounter: Payer: Self-pay | Admitting: Family Medicine

## 2024-05-16 ENCOUNTER — Ambulatory Visit: Payer: Self-pay

## 2024-05-16 VITALS — BP 152/90 | HR 82 | Temp 98.0°F | Ht 67.0 in | Wt 185.2 lb

## 2024-05-16 DIAGNOSIS — J0100 Acute maxillary sinusitis, unspecified: Secondary | ICD-10-CM

## 2024-05-16 MED ORDER — CEFTRIAXONE SODIUM 500 MG IJ SOLR
500.0000 mg | Freq: Once | INTRAMUSCULAR | Status: AC
  Administered 2024-05-16: 500 mg via INTRAMUSCULAR

## 2024-05-16 NOTE — Progress Notes (Signed)
 BP (!) 152/90   Pulse 82   Temp 98 F (36.7 C) (Oral)   Ht 5' 7 (1.702 m)   Wt 185 lb 3.2 oz (84 kg)   SpO2 96%   BMI 29.01 kg/m    Subjective:    Patient ID: Luke Griffin, male    DOB: [birth year], 63 y.o.   MRN: [id number]  HPI: Luke Griffin is a 63 y.o. male  Chief Complaint  Patient presents with   Cough    Onset about a week. Denies fever, body aches, or chills.    Nasal Congestion   Sinusitis   UPPER RESPIRATORY TRACT INFECTION Duration: about a week Worst symptom: congestion  Fever: no Cough: yes Shortness of breath: no Wheezing: yes Chest pain: no Chest tightness: no Chest congestion: no Nasal congestion: yes Runny nose: yes Post nasal drip: yes Sneezing: no Sore throat: no Swollen glands: no Sinus pressure: yes Headache: no Face pain: no Toothache: no Ear pain: no  Ear pressure: no  Eyes red/itching:no Eye drainage/crusting: no  Vomiting: no Rash: no Fatigue: yes Sick contacts: no Strep contacts: no  Context: worse Recurrent sinusitis: no Relief with OTC cold/cough medications: no  Treatments attempted: none   Relevant past medical, surgical, family and social history reviewed and updated as indicated. Interim medical history since our last visit reviewed. Allergies and medications reviewed and updated.  Review of Systems  Constitutional:  Positive for fatigue. Negative for activity change, appetite change, chills, diaphoresis, fever and unexpected weight change.  HENT:  Positive for congestion, postnasal drip, rhinorrhea and sinus pressure. Negative for dental problem, drooling, ear discharge, ear pain, facial swelling, hearing loss, mouth sores, nosebleeds, sinus pain, sneezing, sore throat, tinnitus, trouble swallowing and voice change.   Eyes: Negative.   Respiratory:  Positive for cough. Negative for apnea, choking, chest tightness, shortness of breath, wheezing and stridor.   Cardiovascular: Negative.   Psychiatric/Behavioral:  Negative.      Per HPI unless specifically indicated above     Objective:    BP (!) 152/90   Pulse 82   Temp 98 F (36.7 C) (Oral)   Ht 5' 7 (1.702 m)   Wt 185 lb 3.2 oz (84 kg)   SpO2 96%   BMI 29.01 kg/m   Wt Readings from Last 3 Encounters:  05/16/24 185 lb 3.2 oz (84 kg)  04/20/24 180 lb 9.6 oz (81.9 kg)  02/23/24 173 lb 12.8 oz (78.8 kg)    Physical Exam Vitals and nursing note reviewed.  Constitutional:      General: He is not in acute distress.    Appearance: Normal appearance. He is not ill-appearing, toxic-appearing or diaphoretic.  HENT:     Head: Normocephalic and atraumatic.     Right Ear: Tympanic membrane, ear canal and external ear normal.     Left Ear: Tympanic membrane and external ear normal.     Nose: Congestion and rhinorrhea present.     Mouth/Throat:     Mouth: Mucous membranes are moist.     Pharynx: Oropharynx is clear. No oropharyngeal exudate or posterior oropharyngeal erythema.  Eyes:     General: No scleral icterus.       Right eye: No discharge.        Left eye: No discharge.     Extraocular Movements: Extraocular movements intact.     Conjunctiva/sclera: Conjunctivae normal.     Pupils: Pupils are equal, round, and reactive to light.  Cardiovascular:     Rate and  Rhythm: Normal rate and regular rhythm.     Pulses: Normal pulses.     Heart sounds: Normal heart sounds. No murmur heard.    No friction rub. No gallop.  Pulmonary:     Effort: Pulmonary effort is normal. No respiratory distress.     Breath sounds: No stridor. No wheezing, rhonchi or rales.     Comments: Coarse breath sounds bilaterally Chest:     Chest wall: No tenderness.  Musculoskeletal:        General: Normal range of motion.     Cervical back: Normal range of motion and neck supple.  Skin:    General: Skin is warm and dry.     Capillary Refill: Capillary refill takes less than 2 seconds.     Coloration: Skin is not jaundiced or pale.     Findings: No bruising,  erythema, lesion or rash.  Neurological:     General: No focal deficit present.     Mental Status: He is alert and oriented to person, place, and time. Mental status is at baseline.  Psychiatric:        Mood and Affect: Mood normal.        Behavior: Behavior normal.        Thought Content: Thought content normal.        Judgment: Judgment normal.     Results for orders placed or performed in visit on 02/08/24  PSA   Collection Time: 02/08/24  9:50 AM  Result Value Ref Range   Prostate Specific Ag, Serum 26.8 (H) 0.0 - 4.0 ng/mL  Microscopic Examination   Collection Time: 02/08/24 10:03 AM   Urine  Result Value Ref Range   WBC, UA 0-5 0 - 5 /hpf   RBC, Urine 0-2 0 - 2 /hpf   Epithelial Cells (non renal) 0-10 0 - 10 /hpf   Bacteria, UA None seen None seen/Few  Urinalysis, Complete   Collection Time: 02/08/24 10:03 AM  Result Value Ref Range   Specific Gravity, UA 1.025 1.005 - 1.030   pH, UA 6.0 5.0 - 7.5   Color, UA Yellow Yellow   Appearance Ur Clear Clear   Leukocytes,UA Negative Negative   Protein,UA Trace Negative/Trace   Glucose, UA Negative Negative   Ketones, UA Negative Negative   RBC, UA Negative Negative   Bilirubin, UA Negative Negative   Urobilinogen, Ur 0.2 0.2 - 1.0 mg/dL   Nitrite, UA Negative Negative   Microscopic Examination Comment    Microscopic Examination See below:       Assessment & Plan:   Problem List Items Addressed This Visit   None Visit Diagnoses       Acute non-recurrent maxillary sinusitis    -  Primary   Will treat with ceftriaxone  shot. Call if not getting better or getting worse.   Relevant Medications   cefTRIAXone  (ROCEPHIN ) injection 500 mg        Follow up plan: Return for As scheduled.

## 2024-05-16 NOTE — Telephone Encounter (Signed)
 FYI Only or Action Required?: FYI only for provider: appointment scheduled on 05/16/24.  Patient was last seen in primary care on 04/20/2024 by Melvin Pao, NP.  Called Nurse Triage reporting Cough.  Symptoms began a week ago.  Interventions attempted: OTC medications: Tylenol, Halls cough drops.  Symptoms are: productive cough, intermittent wheezing relieved by cough, runny nose, nasal congestion gradually worsening.  Triage Disposition: See HCP Within 4 Hours (Or PCP Triage)  Patient/caregiver understands and will follow disposition?: Yes             Copied from CRM #[id number]. Topic: Clinical - Red Word Triage >> May 16, 2024  7:33 AM Tobias L wrote: Red Word that prompted transfer to Nurse Triage: green phlegm coming up from lungs Reason for Disposition  Wheezing is present  Answer Assessment - Initial Assessment Questions 1. ONSET: When did the cough begin?      Last Tuesday.  2. SEVERITY: How bad is the cough today?      He states he feels better when he is upright, most severe at night. Difficult to breath at night and coughing keeps from sleeping. Persistent cough.  3. SPUTUM: Describe the color of your sputum (e.g., none, dry cough; clear, white, yellow, green)     Green.  4. HEMOPTYSIS: Are you coughing up any blood? If Yes, ask: How much? (e.g., flecks, streaks, tablespoons, etc.)     No.  5. DIFFICULTY BREATHING: Are you having difficulty breathing? If Yes, ask: How bad is it? (e.g., mild, moderate, severe)     Np, as long as I'm upright  6. FEVER: Do you have a fever? If Yes, ask: What is your temperature, how was it measured, and when did it start?     No.  7. CARDIAC HISTORY: Do you have any history of heart disease? (e.g., heart attack, congestive heart failure)      HTN.  8. LUNG HISTORY: Do you have any history of lung disease?  (e.g., pulmonary embolus, asthma, emphysema)     No, he states he is a smoker but no known  history of lung disease.  9. PE RISK FACTORS: Do you have a history of blood clots? (or: recent major surgery, recent prolonged travel, bedridden)     No.  10. OTHER SYMPTOMS: Do you have any other symptoms? (e.g., runny nose, wheezing, chest pain)       Wheezing just prior to coughing and resolves after coughing and runny nose, nasal congestion when lying down.  11. PREGNANCY: Is there any chance you are pregnant? When was your last menstrual period?       N/A.  12. TRAVEL: Have you traveled out of the country in the last month? (e.g., travel history, exposures)       He states his girlfriend had a cold and was seen at the office and she was given steroids and her illness is still lingering. No known flu or COVID exposure or travel.  Patient states he has tried treating at home with Tylenol and Halls cough drops.  Protocols used: Cough - Acute Productive-A-AH

## 2024-07-12 ENCOUNTER — Other Ambulatory Visit: Payer: Self-pay | Admitting: Nurse Practitioner

## 2024-07-13 NOTE — Telephone Encounter (Signed)
 Requested Prescriptions  Pending Prescriptions Disp Refills   albuterol  (VENTOLIN  HFA) 108 (90 Base) MCG/ACT inhaler [Pharmacy Med Name: Albuterol  Sulfate HFA 108 (90 Base) MCG/ACT Inhalation Aerosol Solution] 9 g 0    Sig: INHALE 2 PUFFS BY MOUTH EVERY 6 HOURS AS NEEDED FOR WHEEZING FOR SHORTNESS OF BREATH     Pulmonology:  Beta Agonists 2 Failed - 07/13/2024 11:58 AM      Failed - Last BP in normal range    BP Readings from Last 1 Encounters:  05/16/24 (!) 152/90         Passed - Last Heart Rate in normal range    Pulse Readings from Last 1 Encounters:  05/16/24 82         Passed - Valid encounter within last 12 months    Recent Outpatient Visits           1 month ago Acute non-recurrent maxillary sinusitis   [place] [facility] [place], Megan P, DO   2 months ago Primary hypertension   [place] [facility] Melvin Pao, NP   4 months ago Controlled type 2 diabetes mellitus with complication, without long-term current use of insulin [facility])   [place] [facility] Melvin Pao, NP   5 months ago Fatigue, unspecified type   [place] [facility] [place] [place], Megan P, DO   7 months ago Right leg pain   [place] [facility] Herold Hadassah SQUIBB, MD

## 2024-07-14 ENCOUNTER — Other Ambulatory Visit: Payer: Self-pay | Admitting: Nurse Practitioner

## 2024-07-14 DIAGNOSIS — I10 Essential (primary) hypertension: Secondary | ICD-10-CM

## 2024-07-15 ENCOUNTER — Other Ambulatory Visit: Payer: Self-pay | Admitting: Nurse Practitioner

## 2024-07-15 NOTE — Telephone Encounter (Signed)
 Requested Prescriptions  Pending Prescriptions Disp Refills   amLODipine  (NORVASC ) 10 MG tablet [Pharmacy Med Name: amLODIPine  Besylate 10 MG Oral Tablet] 90 tablet 0    Sig: Take 1 tablet by mouth once daily     Cardiovascular: Calcium  Channel Blockers 2 Failed - 07/15/2024  2:57 PM      Failed - Last BP in normal range    BP Readings from Last 1 Encounters:  05/16/24 (!) 152/90         Passed - Last Heart Rate in normal range    Pulse Readings from Last 1 Encounters:  05/16/24 82         Passed - Valid encounter within last 6 months    Recent Outpatient Visits           2 months ago Acute non-recurrent maxillary sinusitis   [place] [facility] [place], [place], DO   2 months ago Primary hypertension   [place] [facility] Melvin Pao, NP   4 months ago Controlled type 2 diabetes mellitus with complication, without long-term current use of insulin (HCC)   [place] [facility] Melvin Pao, NP   5 months ago Fatigue, unspecified type   [place] [facility] [place], Megan P, DO   7 months ago Right leg pain   [place] [facility] Herold Hadassah SQUIBB, MD               hydrOXYzine  (VISTARIL ) 25 MG capsule [Pharmacy Med Name: hydrOXYzine  Pamoate 25 MG Oral Capsule] 90 capsule 0    Sig: TAKE 1 CAPSULE BY MOUTH AS NEEDED     Ear, Nose, and Throat:  Antihistamines 2 Passed - 07/15/2024  2:57 PM      Passed - Cr in normal range and within 360 days    Creatinine, Ser  Date Value Ref Range Status  01/26/2024 0.85 0.76 - 1.27 mg/dL Final         Passed - Valid encounter within last 12 months    Recent Outpatient Visits           2 months ago Acute non-recurrent maxillary sinusitis   [place] [facility] [place], Megan P, DO   2 months ago Primary hypertension   [place] [facility] Melvin Pao, NP   4 months ago Controlled type 2 diabetes mellitus  with complication, without long-term current use of insulin [facility]   [place] [facility] Melvin Pao, NP   5 months ago Fatigue, unspecified type   [place] [facility] [place], Megan P, DO   7 months ago Right leg pain    [facility] Herold Hadassah SQUIBB, MD

## 2024-07-18 NOTE — Telephone Encounter (Signed)
 Requested medication (s) are due for refill today -no  Requested medication (s) are on the active medication list -yes  Future visit scheduled -yes  Last refill: 07/15/24  Notes to clinic:   Pharmacy comment: Please clarify the directions  for this prescription. how often can pt take medication? thanks!    Requested Prescriptions  Pending Prescriptions Disp Refills   hydrOXYzine  (VISTARIL ) 25 MG capsule [Pharmacy Med Name: HYDROXYZINE  PAMOATE 25MG  CAP] 90 capsule 0    Sig: TAKE 1 CAPSULE BY MOUTH AS NEEDED     Ear, Nose, and Throat:  Antihistamines 2 Passed - 07/18/2024  2:17 PM      Passed - Cr in normal range and within 360 days    Creatinine, Ser  Date Value Ref Range Status  01/26/2024 0.85 0.76 - 1.27 mg/dL Final         Passed - Valid encounter within last 12 months    Recent Outpatient Visits           2 months ago Acute non-recurrent maxillary sinusitis   [place] [facility] [place], Megan P, DO   2 months ago Primary hypertension   [place] [facility] Melvin Pao, NP   4 months ago Controlled type 2 diabetes mellitus with complication, without long-term current use of insulin (HCC)   [place] [facility] Melvin Pao, NP   5 months ago Fatigue, unspecified type   [place] [facility] [place], Megan P, DO   7 months ago Right leg pain   [place] [facility] Herold Hadassah SQUIBB, MD                 Requested Prescriptions  Pending Prescriptions Disp Refills   hydrOXYzine  (VISTARIL ) 25 MG capsule [Pharmacy Med Name: HYDROXYZINE  PAMOATE 25MG  CAP] 90 capsule 0    Sig: TAKE 1 CAPSULE BY MOUTH AS NEEDED     Ear, Nose, and Throat:  Antihistamines 2 Passed - 07/18/2024  2:17 PM      Passed - Cr in normal range and within 360 days    Creatinine, Ser  Date Value Ref Range Status  01/26/2024 0.85 0.76 - 1.27 mg/dL Final         Passed - Valid encounter within last 12 months     Recent Outpatient Visits           2 months ago Acute non-recurrent maxillary sinusitis   [place] [facility] [place], Megan P, DO   2 months ago Primary hypertension   [place] [facility] Melvin Pao, NP   4 months ago Controlled type 2 diabetes mellitus with complication, without long-term current use of insulin [facility])   [place] [facility] Melvin Pao, NP   5 months ago Fatigue, unspecified type   [place] [facility] [place], Megan P, DO   7 months ago Right leg pain   [place] [facility] Herold Hadassah SQUIBB, MD

## 2024-07-19 ENCOUNTER — Other Ambulatory Visit: Payer: Self-pay | Admitting: Nurse Practitioner

## 2024-07-19 ENCOUNTER — Ambulatory Visit: Payer: Self-pay

## 2024-07-19 NOTE — Telephone Encounter (Signed)
 FYI Only or Action Required?: FYI only for provider: appointment scheduled on 07/20/24.  Patient was last seen in primary care on 05/16/2024 by Vicci Duwaine SQUIBB, DO.  Called Nurse Triage reporting Knee Pain.  Symptoms began several weeks ago.  Interventions attempted: OTC medications: tylenol; BC.  Symptoms are: gradually worsening.  Triage Disposition: See Physician Within 24 Hours  Patient/caregiver understands and will follow disposition?: Yes     Message from Sophia H sent at 07/19/2024  5:16 PM EST  Reason for Triage: Gout flare up - Severe pain & swelling in knee. Requesting appt with PCP.     Reason for Disposition  [1] Very swollen joint AND [2] no fever  Answer Assessment - Initial Assessment Questions Pt called to report L knee pain and swelling x 2 weeks. Pt states usually gout flares last one week then start to improve but pain has continued despite usual interventions. Pt has tried tylenol, BC and heat. Pt states heat made symptoms worse, causing pain to feel like a burning pain. BC helped manage pain but pt feels medication interacted with is other medications causing dizziness and HTN so d/c at this time. Pt reports pain is 8/10 and not improving. Appointment scheduled for evaluation. Patient agrees with plan of care, and will call back if anything changes, or if symptoms worsen.       1. LOCATION and RADIATION: Where is the pain located?      L knee   2. QUALITY: What does the pain feel like?  (e.g., sharp, dull, aching, burning)     Sharp, burning pain   3. SEVERITY: How bad is the pain? What does it keep you from doing?   (Scale 1-10; or mild, moderate, severe)     8/10   4. ONSET: When did the pain start? Does it come and go, or is it there all the time?     X2 weeks ago   5. RECURRENT: Have you had this pain before? If Yes, ask: When, and what happened then?     Yes; hx of gout. Pt states flares usually last 1 week then improve but this  pain has continued   6. SETTING: Has there been any recent work, exercise or other activity that involved that part of the body?      No   7. AGGRAVATING FACTORS: What makes the knee pain worse? (e.g., walking, climbing stairs, running)     Heat   8. ASSOCIATED SYMPTOMS: Is there any swelling or redness of the knee?     Swelling at knee   9. OTHER SYMPTOMS: Do you have any other symptoms? (e.g., calf pain, chest pain, difficulty breathing, fever)     Calf pain d/t swelling and knee pain  Protocols used: Knee Pain-A-AH

## 2024-07-20 ENCOUNTER — Ambulatory Visit: Payer: Self-pay | Admitting: Nurse Practitioner

## 2024-07-20 ENCOUNTER — Encounter: Payer: Self-pay | Admitting: Nurse Practitioner

## 2024-07-20 VITALS — BP 161/95 | HR 86 | Temp 97.9°F | Ht 67.01 in | Wt 188.8 lb

## 2024-07-20 DIAGNOSIS — M10061 Idiopathic gout, right knee: Secondary | ICD-10-CM

## 2024-07-20 MED ORDER — COLCHICINE 0.6 MG PO TABS: 0.6000 mg | ORAL_TABLET | Freq: Every day | ORAL | 0 refills | Status: AC

## 2024-07-20 NOTE — Telephone Encounter (Signed)
 Scheduled for this afternoon

## 2024-07-20 NOTE — Progress Notes (Signed)
 "  BP (!) 161/95 (BP Location: Left Arm, Cuff Size: Normal)   Pulse 86   Temp 97.9 F (36.6 C) (Oral)   Ht 5' 7.01 (1.702 m)   Wt 188 lb 12.8 oz (85.6 kg)   SpO2 98%   BMI 29.56 kg/m    Subjective:    Patient ID: Luke Griffin, male    DOB: October 01, 1960, 64 y.o.   MRN: [id number]  HPI: Luke Griffin is a 64 y.o. male  Chief Complaint  Patient presents with   Gout    Right knee flare. Patient stated it started in his right big toe and he massaged it and felt a pop and then a warm sensation when up his leg to his knee. His calf muscle feels tight as well.    GOUT Duration:days Right 1st metatarsophalangeal pain: yes Left 1st metatarsophalangeal pain: no Right knee pain: yes Left knee pain: no Severity: severe  Quality: throbbing Swelling: yes Redness: yes Trauma: no Recent dietary change or indiscretion: no Fevers: no Nausea/vomiting: no Aggravating factors: moving Alleviating factors:  Status:  worse Treatments attempted:  Relevant past medical, surgical, family and social history reviewed and updated as indicated. Interim medical history since our last visit reviewed. Allergies and medications reviewed and updated.  Review of Systems  Musculoskeletal:        Right knee pain, swelling and redness    Per HPI unless specifically indicated above     Objective:    BP (!) 161/95 (BP Location: Left Arm, Cuff Size: Normal)   Pulse 86   Temp 97.9 F (36.6 C) (Oral)   Ht 5' 7.01 (1.702 m)   Wt 188 lb 12.8 oz (85.6 kg)   SpO2 98%   BMI 29.56 kg/m   Wt Readings from Last 3 Encounters:  07/20/24 188 lb 12.8 oz (85.6 kg)  05/16/24 185 lb 3.2 oz (84 kg)  04/20/24 180 lb 9.6 oz (81.9 kg)    Physical Exam Vitals and nursing note reviewed.  Constitutional:      General: He is not in acute distress.    Appearance: Normal appearance. He is not ill-appearing, toxic-appearing or diaphoretic.  HENT:     Head: Normocephalic.     Right Ear: External ear normal.      Left Ear: External ear normal.     Nose: Nose normal. No congestion or rhinorrhea.     Mouth/Throat:     Mouth: Mucous membranes are moist.  Eyes:     General:        Right eye: No discharge.        Left eye: No discharge.     Extraocular Movements: Extraocular movements intact.     Conjunctiva/sclera: Conjunctivae normal.     Pupils: Pupils are equal, round, and reactive to light.  Cardiovascular:     Rate and Rhythm: Normal rate and regular rhythm.     Heart sounds: No murmur heard. Pulmonary:     Effort: Pulmonary effort is normal. No respiratory distress.     Breath sounds: Normal breath sounds. No wheezing, rhonchi or rales.  Abdominal:     General: Abdomen is flat. Bowel sounds are normal.  Musculoskeletal:     Cervical back: Normal range of motion and neck supple.     Right knee: Swelling and erythema present. Decreased range of motion. Tenderness present.  Skin:    General: Skin is warm and dry.     Capillary Refill: Capillary refill takes less than 2 seconds.  Neurological:  General: No focal deficit present.     Mental Status: He is alert and oriented to person, place, and time.  Psychiatric:        Mood and Affect: Mood normal.        Behavior: Behavior normal.        Thought Content: Thought content normal.        Judgment: Judgment normal.     Results for orders placed or performed in visit on 02/08/24  PSA   Collection Time: 02/08/24  9:50 AM  Result Value Ref Range   Prostate Specific Ag, Serum 26.8 (H) 0.0 - 4.0 ng/mL  Microscopic Examination   Collection Time: 02/08/24 10:03 AM   Urine  Result Value Ref Range   WBC, UA 0-5 0 - 5 /hpf   RBC, Urine 0-2 0 - 2 /hpf   Epithelial Cells (non renal) 0-10 0 - 10 /hpf   Bacteria, UA None seen None seen/Few  Urinalysis, Complete   Collection Time: 02/08/24 10:03 AM  Result Value Ref Range   Specific Gravity, UA 1.025 1.005 - 1.030   pH, UA 6.0 5.0 - 7.5   Color, UA Yellow Yellow   Appearance Ur  Clear Clear   Leukocytes,UA Negative Negative   Protein,UA Trace Negative/Trace   Glucose, UA Negative Negative   Ketones, UA Negative Negative   RBC, UA Negative Negative   Bilirubin, UA Negative Negative   Urobilinogen, Ur 0.2 0.2 - 1.0 mg/dL   Nitrite, UA Negative Negative   Microscopic Examination Comment    Microscopic Examination See below:       Assessment & Plan:   Problem List Items Addressed This Visit   None Visit Diagnoses       Acute idiopathic gout of right knee    -  Primary   Will treat with Colchicine .  Complete course of medicaiton.  Follow up if not improved.   Relevant Medications   colchicine  0.6 MG tablet        Follow up plan: No follow-ups on file.      "

## 2024-07-20 NOTE — Telephone Encounter (Signed)
 Requested medication (s) are due for refill today: yes  Requested medication (s) are on the active medication list: yes  Last refill:  07/15/24  Future visit scheduled: {Yes  Notes to clinic:  Pharmacy comment: Please clarify the directions  for this prescription. need specific frequency. thanks!      Requested Prescriptions  Pending Prescriptions Disp Refills   hydrOXYzine  (VISTARIL ) 25 MG capsule [Pharmacy Med Name: HYDROXYZINE  PAMOATE 25MG  CAP] 90 capsule 0    Sig: TAKE 1 CAPSULE BY MOUTH AS NEEDED     Ear, Nose, and Throat:  Antihistamines 2 Passed - 07/20/2024  4:22 PM      Passed - Cr in normal range and within 360 days    Creatinine, Ser  Date Value Ref Range Status  01/26/2024 0.85 0.76 - 1.27 mg/dL Final         Passed - Valid encounter within last 12 months    Recent Outpatient Visits           Today Acute idiopathic gout of right knee   [place] [facility] Melvin Pao, NP   2 months ago Acute non-recurrent maxillary sinusitis   [place] [facility], Megan P, DO   3 months ago Primary hypertension   [place] [facility] Melvin Pao, NP   4 months ago Controlled type 2 diabetes mellitus with complication, without long-term current use of insulin [facility])   [place] [facility] Melvin Pao, NP   5 months ago Fatigue, unspecified type   [place] [facility], Megan P, DO

## 2024-07-21 NOTE — Telephone Encounter (Signed)
 Please clarify the directions  for this prescription. need specific frequency. thanks!

## 2024-09-19 ENCOUNTER — Ambulatory Visit: Payer: Self-pay | Admitting: Nurse Practitioner
